# Patient Record
Sex: Male | Born: 1980 | Race: White | Hispanic: No | Marital: Married | Smoking: Never smoker
Health system: Southern US, Community
[De-identification: ages and names within clinical notes are randomized; demographics above are authoritative.]

## PROBLEM LIST (undated history)

## (undated) DIAGNOSIS — I491 Atrial premature depolarization: Secondary | ICD-10-CM

## (undated) DIAGNOSIS — I10 Essential (primary) hypertension: Secondary | ICD-10-CM

## (undated) DIAGNOSIS — R0600 Dyspnea, unspecified: Secondary | ICD-10-CM

## (undated) DIAGNOSIS — R002 Palpitations: Secondary | ICD-10-CM

## (undated) DIAGNOSIS — Z91018 Allergy to other foods: Secondary | ICD-10-CM

## (undated) DIAGNOSIS — I493 Ventricular premature depolarization: Secondary | ICD-10-CM

## (undated) DIAGNOSIS — G629 Polyneuropathy, unspecified: Secondary | ICD-10-CM

## (undated) DIAGNOSIS — R0609 Other forms of dyspnea: Secondary | ICD-10-CM

## (undated) DIAGNOSIS — G4733 Obstructive sleep apnea (adult) (pediatric): Secondary | ICD-10-CM

## (undated) DIAGNOSIS — E785 Hyperlipidemia, unspecified: Secondary | ICD-10-CM

## (undated) DIAGNOSIS — K219 Gastro-esophageal reflux disease without esophagitis: Secondary | ICD-10-CM

## (undated) HISTORY — DX: Palpitations: R00.2

## (undated) HISTORY — PX: NO PAST SURGERIES: SHX2092

## (undated) HISTORY — DX: Dyspnea, unspecified: R06.00

## (undated) HISTORY — DX: Other forms of dyspnea: R06.09

## (undated) HISTORY — DX: Atrial premature depolarization: I49.1

## (undated) HISTORY — DX: Ventricular premature depolarization: I49.3

## (undated) HISTORY — DX: Gastro-esophageal reflux disease without esophagitis: K21.9

## (undated) HISTORY — DX: Polyneuropathy, unspecified: G62.9

## (undated) HISTORY — DX: Allergy to other foods: Z91.018

## (undated) HISTORY — DX: Hyperlipidemia, unspecified: E78.5

## (undated) HISTORY — DX: Obstructive sleep apnea (adult) (pediatric): G47.33

## (undated) HISTORY — DX: Essential (primary) hypertension: I10

---

## 2018-04-18 ENCOUNTER — Ambulatory Visit (INDEPENDENT_AMBULATORY_CARE_PROVIDER_SITE_OTHER): Payer: PRIVATE HEALTH INSURANCE | Admitting: Cardiology

## 2018-04-18 ENCOUNTER — Encounter: Payer: Self-pay | Admitting: Cardiology

## 2018-04-18 DIAGNOSIS — R0789 Other chest pain: Secondary | ICD-10-CM | POA: Diagnosis not present

## 2018-04-18 DIAGNOSIS — G4733 Obstructive sleep apnea (adult) (pediatric): Secondary | ICD-10-CM

## 2018-04-18 DIAGNOSIS — R0609 Other forms of dyspnea: Secondary | ICD-10-CM

## 2018-04-18 DIAGNOSIS — I10 Essential (primary) hypertension: Secondary | ICD-10-CM

## 2018-04-18 DIAGNOSIS — E782 Mixed hyperlipidemia: Secondary | ICD-10-CM

## 2018-04-18 NOTE — Patient Instructions (Signed)
Medication Instructions:  Your physician recommends that you continue on your current medications as directed. Please refer to the Current Medication list given to you today.  If you need a refill on your cardiac medications before your next appointment, please call your pharmacy.   Lab work: None.  If you have labs (blood work) drawn today and your tests are completely normal, you will receive your results only by: Marland Kitchen. MyChart Message (if you have MyChart) OR . A paper copy in the mail If you have any lab test that is abnormal or we need to change your treatment, we will call you to review the results.  Testing/Procedures: Your physician has requested that you have an echocardiogram. Echocardiography is a painless test that uses sound waves to create images of your heart. It provides your doctor with information about the size and shape of your heart and how well your heart's chambers and valves are working. This procedure takes approximately one hour. There are no restrictions for this procedure.  Your physician has requested that you have a stress echocardiogram. For further information please visit [email or web address]. Please follow instruction sheet as given.    Follow-Up: At [facility], you and your health needs are our priority.  As part of our continuing mission to provide you with exceptional heart care, we have created designated Provider Care Teams.  These Care Teams include your primary Cardiologist (physician) and Advanced Practice Providers (APPs -  Physician Assistants and Nurse Practitioners) who all work together to provide you with the care you need, when you need it. You will need a follow up appointment in 6 weeks.  Please call our office 2 months in advance to schedule this appointment.  You may see No primary care provider on file. or another member of our [facility] Provider Team in : Norman HerrlichBrian Munley, MD . Belva Cromeajan Revankar, MD  Any Other Special Instructions  Will Be Listed Below (If Applicable).  Echocardiogram An echocardiogram, or echocardiography, uses sound waves (ultrasound) to produce an image of your heart. The echocardiogram is simple, painless, obtained within a short period of time, and offers valuable information to your health care provider. The images from an echocardiogram can provide information such as:  Evidence of coronary artery disease (CAD).  Heart size.  Heart muscle function.  Heart valve function.  Aneurysm detection.  Evidence of a past heart attack.  Fluid buildup around the heart.  Heart muscle thickening.  Assess heart valve function.  Tell a health care provider about:  Any allergies you have.  All medicines you are taking, including vitamins, herbs, eye drops, creams, and over-the-counter medicines.  Any problems you or family members have had with anesthetic medicines.  Any blood disorders you have.  Any surgeries you have had.  Any medical conditions you have.  Whether you are pregnant or may be pregnant. What happens before the procedure? No special preparation is needed. Eat and drink normally. What happens during the procedure?  In order to produce an image of your heart, gel will be applied to your chest and a wand-like tool (transducer) will be moved over your chest. The gel will help transmit the sound waves from the transducer. The sound waves will harmlessly bounce off your heart to allow the heart images to be captured in real-time motion. These images will then be recorded.  You may need an IV to receive a medicine that improves the quality of the pictures. What happens after the procedure? You may return to your  normal schedule including diet, activities, and medicines, unless your health care provider tells you otherwise. This information is not intended to replace advice given to you by your health care provider. Make sure you discuss any questions you have with your health care  provider. Document Released: 05/12/2000 Document Revised: 01/01/2016 Document Reviewed: 01/20/2013 Elsevier Interactive Patient Education  2017 Elsevier Inc.   Exercise Stress Echocardiogram An exercise stress echocardiogram is a test to check how well your heart is working. This test uses sound waves (ultrasound) and a computer to make images of your heart before and after exercise. Ultrasound images that are taken before you exercise (your resting echocardiogram) will show how much blood is getting to your heart muscle and how well your heart muscle and heart valves are functioning. During the next part of this test, you will walk on a treadmill or ride a stationary bike to see how exercise affects your heart. While you exercise, the electrical activity of your heart will be monitored with an electrocardiogram (ECG). Your blood pressure will also be monitored. You may have this test if you:  Have chest pain or other symptoms of a heart problem.  Recently had a heart attack or heart surgery.  Have heart valve problems.  Have a condition that causes narrowing of the blood vessels that supply your heart (coronary artery disease).  Have a high risk of heart disease and are starting a new exercise program.  Have a high risk of heart disease and need to have major surgery.  Tell a health care provider about:  Any allergies you have.  All medicines you are taking, including vitamins, herbs, eye drops, creams, and over-the-counter medicines.  Any problems you or family members have had with anesthetic medicines.  Any blood disorders you have.  Any surgeries you have had.  Any medical conditions you have.  Whether you are pregnant or may be pregnant. What are the risks? Generally, this is a safe procedure. However, problems may occur, including:  Chest pain.  Dizziness or light-headedness.  Shortness of breath.  Increased or irregular heartbeat (palpitations).  Nausea or  vomiting.  Heart attack (very rare).  What happens before the procedure?  Follow instructions from your health care provider about eating or drinking restrictions. You may be asked to avoid all forms of caffeine for 24 hours before your procedure, or as told by your health care provider.  Ask your health care provider about changing or stopping your regular medicines. This is especially important if you are taking diabetes medicines or blood thinners.  If you use an inhaler, bring it with you to the test.  Wear loose, comfortable clothing and walking shoes.  Do notuse any products that contain nicotine or tobacco, such as cigarettes and e-cigarettes, for 4 hours before the test or as told by your health care provider. If you need help quitting, ask your health care provider. What happens during the procedure?  You will take off your clothes from the waist up and put on a hospital gown.  A technician will place electrodes on your chest.  A blood pressure cuff will be placed on your arm.  You will lie down on a table for an ultrasound exam before you exercise. Gel will be rubbed on your chest, and a handheld device (transducer) will be pressed against your chest and moved over your heart.  Then, you will start exercising by walking on a treadmill or pedaling a stationary bicycle.  Your blood pressure and heart  rhythm will be monitored while you exercise.  The exercise will gradually get harder or faster.  You will exercise until: ? Your heart reaches a target level. ? You are too tired to continue. ? You cannot continue because of chest pain, weakness, or dizziness.  You will have another ultrasound exam after you stop exercising. The procedure may vary among health care providers and hospitals. What happens after the procedure?  Your heart rate and blood pressure will be monitored until they return to your normal levels. Summary  An exercise stress echocardiogram is a test  that uses ultrasound to check how well your heart works before and after exercise.  Before the test, follow instructions from your health care provider about stopping medications, avoiding nicotine and tobacco, and avoiding certain foods and drinks.  During the test, your blood pressure and heart rhythm will be monitored while you exercise on a treadmill or stationary bicycle. This information is not intended to replace advice given to you by your health care provider. Make sure you discuss any questions you have with your health care provider. Document Released: 05/19/2004 Document Revised: 01/05/2016 Document Reviewed: 01/05/2016 Elsevier Interactive Patient Education  2018 [employer].

## 2018-04-18 NOTE — Progress Notes (Signed)
Cardiology Consultation:    Date:  04/18/2018   ID:  Real Cons, DOB [birth year], MRN [id number]  PCP:  Nonnie Done., MD  Cardiologist:  Gypsy Balsam, MD   Referring MD: Nonnie Done., MD   Chief Complaint  Patient presents with  . Chest Pain    Felt to be due to sleep apnea, Sleep study on Monday  . Hypertension  I have high blood pressure also chest pain  History of Present Illness:    Martin Berry is a 37 y.o. male who is being seen today for the evaluation of chest pain high blood pressure at the request of [place], Excell Seltzer., MD.  His past medical history significant for hypertension for many years he is taking 2 medications right now 1 his amlodipine second 1 is metoprolol.  He was referred to [place] because of chest pain he does have atypical chest pain he sometimes wake up in the middle of the night only in the morning with heavy-like sensation in the chest walking around moving around make the sensation to get better he is to exercise on the regular basis but does not do it anymore he is too busy.  He works for [employer] which required a lot of walking he is doing well doing it.  He described to have some exertional shortness of breath.  He does snore a lot in the matter-of-fact he is scheduled to have sleep study done on Monday which I think is an excellent idea he said he has been taking medications for his high blood pressure for about 10 years. He does not have significant risk factors for coronary artery disease except for a high blood pressure.  Past Medical History:  Diagnosis Date  . Hyperlipidemia   . Hypertension       Current Medications: Current Meds  Medication Sig  . amLODipine (NORVASC) 5 MG tablet Take 1 tablet by mouth daily.  Marland Kitchen atorvastatin (LIPITOR) 10 MG tablet Take 1 tablet by mouth daily.  . metoprolol succinate (TOPROL-XL) 25 MG 24 hr tablet Take 1 tablet by mouth daily.     Allergies:   Patient has no known allergies.   Social  History   Socioeconomic History  . Marital status: Married    Spouse name: Not on file  . Number of children: Not on file  . Years of education: Not on file  . Highest education level: Not on file  Occupational History  . Not on file  Social Needs  . Financial resource strain: Not on file  . Food insecurity:    Worry: Not on file    Inability: Not on file  . Transportation needs:    Medical: Not on file    Non-medical: Not on file  Tobacco Use  . Smoking status: Former Games developer  . Smokeless tobacco: Former Engineer, water and Sexual Activity  . Alcohol use: Never    Frequency: Never  . Drug use: Never  . Sexual activity: Not on file  Lifestyle  . Physical activity:    Days per week: Not on file    Minutes per session: Not on file  . Stress: Not on file  Relationships  . Social connections:    Talks on phone: Not on file    Gets together: Not on file    Attends religious service: Not on file    Active member of club or organization: Not on file    Attends meetings of clubs or organizations: Not on  file    Relationship status: Not on file  Other Topics Concern  . Not on file  Social History Narrative  . Not on file     Family History: The patient's family history includes Healthy in his mother; Hyperlipidemia in his father. ROS:   Please see the history of present illness.    All 14 point review of systems negative except as described per history of present illness.  EKGs/Labs/Other Studies Reviewed:    The following studies were reviewed today:   EKG:  EKG is  ordered today.  The ekg ordered today demonstrates sinus bradycardia rate 53 normal P interval normal QS complex duration morphology no evidence of left ventricle hypertrophy  Recent Labs: No results found for requested labs within last 8760 hours.  Recent Lipid Panel No results found for: CHOL, TRIG, HDL, CHOLHDL, VLDL, LDLCALC, LDLDIRECT  Physical Exam:    VS:  BP 140/90   Pulse 60   Ht 6\' 3"   (1.905 m)   Wt 274 lb (124.3 kg)   SpO2 96%   BMI 34.25 kg/m     Wt Readings from Last 3 Encounters:  04/18/18 274 lb (124.3 kg)     GEN:  Well nourished, well developed in no acute distress HEENT: Normal NECK: No JVD; No carotid bruits LYMPHATICS: No lymphadenopathy CARDIAC: RRR, no murmurs, no rubs, no gallops RESPIRATORY:  Clear to auscultation without rales, wheezing or rhonchi  ABDOMEN: Soft, non-tender, non-distended MUSCULOSKELETAL:  No edema; No deformity  SKIN: Warm and dry NEUROLOGIC:  Alert and oriented x 3 PSYCHIATRIC:  Normal affect   ASSESSMENT:    1. Obstructive sleep apnea   2. Essential hypertension   3. Atypical chest pain   4. Dyspnea on exertion   5. Mixed dyslipidemia    PLAN:    In order of problems listed above:  1. Essential hypertension.  Blood pressure elevated slightly today but this is first visit in my office.  I asked him to continue present management.  Asking also to get blood pressure monitor so he can check his blood pressure on the regular basis.  We talked about nonpharmacological ways to help with the blood pressure which includes avoiding salty food, exercises on the regular basis weight loss.  It looks like he does have significant sleep apnea and appropriate recognition of this problem and appropriate treatment will be very beneficial to him. 2. Obstructive sleep apnea sleep study scheduled for Monday. 3. Atypical chest pain I doubt very much that this is related to his heart however I will schedule him to have stress echocardiogram.  I want to make sure he does not have any significant coronary artery disease also would like to see response of his blood pressure to exercise as well as to see his exercise capacity.  If stress this is normal dental pressure and heart was doing exercises on the regular basis. 4. Mixed dyslipidemia on statin which I will continue.   Medication Adjustments/Labs and Tests Ordered: Current medicines are  reviewed at length with the patient today.  Concerns regarding medicines are outlined above.  No orders of the defined types were placed in this encounter.  No orders of the defined types were placed in this encounter.   Signed, Georgeanna Leaobert J. Alexy Bringle, MD, [facility]. 04/18/2018 10:06 AM    [facility]

## 2018-04-29 ENCOUNTER — Ambulatory Visit (INDEPENDENT_AMBULATORY_CARE_PROVIDER_SITE_OTHER): Payer: PRIVATE HEALTH INSURANCE

## 2018-04-29 ENCOUNTER — Telehealth: Payer: Self-pay | Admitting: Emergency Medicine

## 2018-04-29 DIAGNOSIS — R0789 Other chest pain: Secondary | ICD-10-CM

## 2018-04-29 DIAGNOSIS — R0609 Other forms of dyspnea: Secondary | ICD-10-CM

## 2018-04-29 MED ORDER — TELMISARTAN-HCTZ 40-12.5 MG PO TABS: 1.0000 | ORAL_TABLET | Freq: Every day | ORAL | 3 refills | Status: DC

## 2018-04-29 NOTE — Telephone Encounter (Signed)
Patient's wife calling concerned about stress echo that was done this morning. She reports patient is concerned and wanted clarification on the situation. Informed patient's wife that the patient's blood pressure increased too much for us to continue his stress echo this morning. Dr. Dulce SellarMunley advised we change his medication to get his blood pressure better controlled and have the patient follow up with Dr. Bing MatterKrasowski about possibly doing a cardiac ct. Patient's wife verbally understands, confirmed follow up appointment and she will contact us with any concerns. She would like us to confirm with Dr. Dulce SellarMunley if there are any work restrictions for the patient, I will confirm with him

## 2018-04-29 NOTE — Progress Notes (Signed)
Complete stress echocardiogram has been performed.   Jimmy Tiegan Jambor RDCS, RVT

## 2018-04-29 NOTE — Patient Instructions (Signed)
Medication Instructions:  Your physician has recommended you make the following change in your medication:   START: Micardis- 40-12.5 mg daily.  If you need a refill on your cardiac medications before your next appointment, please call your pharmacy.   Lab work: None. If you have labs (blood work) drawn today and your tests are completely normal, you will receive your results only by: Marland Kitchen. MyChart Message (if you have MyChart) OR . A paper copy in the mail If you have any lab test that is abnormal or we need to change your treatment, we will call you to review the results.  Testing/Procedures: None.   Follow-Up: At [facility], you and your health needs are our priority.  As part of our continuing mission to provide you with exceptional heart care, we have created designated Provider Care Teams.  These Care Teams include your primary Cardiologist (physician) and Advanced Practice Providers (APPs -  Physician Assistants and Nurse Practitioners) who all work together to provide you with the care you need, when you need it. You will need a follow up appointment in 2 weeks.  Please call our office 2 months in advance to schedule this appointment.  You may see No primary care provider on file. or another member of our [facility] Provider Team in [place]: Norman HerrlichBrian Munley, MD . Belva Cromeajan Revankar, MD  Any Other Special Instructions Will Be Listed Below (If Applicable).  Hydrochlorothiazide, HCTZ; Telmisartan tablets What is this medicine? TELMISARTAN; HYDROCHLOROTHIAZIDE (tel mi SAR tan; hye droe klor oh THYE a zide) is a combination of a drug that relaxes blood vessels and a diuretic. It is used to treat high blood pressure. This medicine may be used for other purposes; ask your health care provider or pharmacist if you have questions. COMMON BRAND NAME(S): Micardis HCT What should I tell my health care provider before I take this medicine? They need to know if you have any of these  conditions: -decreased urine -if you are on a special diet, like a low-salt diet -immune system problems, like lupus -kidney disease -liver disease -an unusual or allergic reaction to telmisartan, hydrochlorothiazide, sulfa drugs, other medicines, foods, dyes, or preservatives -pregnant or trying to get pregnant -breast-feeding How should I use this medicine? Take this medicine by mouth with a drink of water. Follow the directions on the prescription label. You can take it with or without food. If it upsets your stomach, take it with food. Take your medicine at regular intervals. Do not take it more often than directed. Do not stop taking except on your doctor's advice. Talk to your pediatrician regarding the use of this medicine in children. Special care may be needed. Overdosage: If you think you have taken too much of this medicine contact a poison control center or emergency room at once. NOTE: This medicine is only for you. Do not share this medicine with others. What if I miss a dose? If you miss a dose, take it as soon as you can. If it is almost time for your next dose, take only that dose. Do not take double or extra doses. What may interact with this medicine? -barbiturates, like phenobarbital -blood pressure medicines -corticosteroids -diabetic medicines -digoxin -diuretics, especially triamterene, spironolactone or amiloride -lithium -NSAIDs, medicines for pain and inflammation, like ibuprofen or naproxen -potassium salts or potassium supplements -prescription pain medicines -skeletal muscle relaxants like tubocurarine -some cholesterol lowering medicines like cholestyramine or colestipol -warfarin This list may not describe all possible interactions. Give your health care  provider a list of all the medicines, herbs, non-prescription drugs, or dietary supplements you use. Also tell them if you smoke, drink alcohol, or use illegal drugs. Some items may interact with your  medicine. What should I watch for while using this medicine? Check your blood pressure regularly while you are taking this medicine. Ask your doctor or health care professional what your blood pressure should be and when you should contact him or her. When you check your blood pressure, write down the measurements to show your doctor or health care professional. If you are taking this medicine for a long time, you must visit your health care professional for regular checks on your progress. Make sure you schedule appointments on a regular basis. You must not get dehydrated. Ask your doctor or health care professional how much fluid you need to drink a day. Check with him or her if you get an attack of severe diarrhea, nausea and vomiting, or if you sweat a lot. The loss of too much body fluid can make it dangerous for you to take this medicine. Women should inform their doctor if they wish to become pregnant or think they might be pregnant. There is a potential for serious side effects to an unborn child, particularly in the second or third trimester. Talk to your health care professional or pharmacist for more information. You may get drowsy or dizzy. Do not drive, use machinery, or do anything that needs mental alertness until you know how this drug affects you. Do not stand or sit up quickly, especially if you are an older patient. This reduces the risk of dizzy or fainting spells. Alcohol can make you more drowsy and dizzy. Avoid alcoholic drinks. This medicine may affect your blood sugar level. If you have diabetes, check with your doctor or health care professional before changing the dose of your diabetic medicine. Avoid salt substitutes unless you are told otherwise by your doctor or health care professional. Do not treat yourself for coughs, colds, or pain while you are taking this medicine without asking your doctor or health care professional for advice. Some ingredients may increase your blood  pressure. This medicine can make you more sensitive to the sun. Keep out of the sun. If you cannot avoid being in the sun, wear protective clothing and use sunscreen. Do not use sun lamps or tanning beds/booths. What side effects may I notice from receiving this medicine? Side effects that you should report to your doctor or health care professional as soon as possible: -allergic reactions like skin rash, itching or hives, swelling of the face, lips, or tongue -breathing problems -changes in vision -dark urine -eye pain -fast or irregular heart beat, palpitations, or chest pain -feeling faint or lightheaded -muscle cramps -persistent dry cough -redness, blistering, peeling or loosening of the skin, including inside the mouth -stomach pain -trouble passing urine or change in the amount of urine -unusual bleeding or bruising -worsened gout pain -yellowing of the eyes or skin Side effects that usually do not require medical attention (report to your doctor or health care professional if they continue or are bothersome): -change in sex drive or performance -headache This list may not describe all possible side effects. Call your doctor for medical advice about side effects. You may report side effects to FDA at 1-800-FDA-1088. Where should I keep my medicine? Keep out of the reach of children. Store at room temperature between 15 and 30 degrees C (59 and 86 degrees F). Tablets should not  be removed from the blisters until right before use. Throw away any unused medicine after the expiration date. NOTE: This sheet is a summary. It may not cover all possible information. If you have questions about this medicine, talk to your doctor, pharmacist, or health care provider.  2018 Elsevier/Gold Standard (2010-02-02 14:52:10)

## 2018-04-29 NOTE — Progress Notes (Addendum)
Complete echocardiogram has been performed.  Jimmy Pier Bosher RDCS, RVT 

## 2018-05-13 ENCOUNTER — Ambulatory Visit (INDEPENDENT_AMBULATORY_CARE_PROVIDER_SITE_OTHER): Payer: PRIVATE HEALTH INSURANCE | Admitting: Cardiology

## 2018-05-13 ENCOUNTER — Encounter: Payer: Self-pay | Admitting: Cardiology

## 2018-05-13 VITALS — BP 130/64 | HR 60 | Ht 75.0 in | Wt 271.0 lb

## 2018-05-13 DIAGNOSIS — E782 Mixed hyperlipidemia: Secondary | ICD-10-CM | POA: Diagnosis not present

## 2018-05-13 DIAGNOSIS — R0789 Other chest pain: Secondary | ICD-10-CM | POA: Diagnosis not present

## 2018-05-13 DIAGNOSIS — G4733 Obstructive sleep apnea (adult) (pediatric): Secondary | ICD-10-CM

## 2018-05-13 DIAGNOSIS — I10 Essential (primary) hypertension: Secondary | ICD-10-CM | POA: Diagnosis not present

## 2018-05-13 NOTE — Progress Notes (Signed)
Cardiology Office Note:    Date:  05/13/2018   ID:  Martin Berry, DOB [birth year], MRN [id number]  PCP:  Nonnie DoneSlatosky, John J., MD  Cardiologist:  Gypsy Balsamobert Rosetta Rupnow, MD    Referring MD: Nonnie DoneSlatosky, John J., MD   Chief Complaint  Patient presents with  . Follow up on testing  Doing well  History of Present Illness:    Martin Berry is a 37 y.o. male with atypical chest pain.  He did have a stress test which showed no evidence of ischemia echocardiogram showed LVH with left atrial enlargement.  He clearly does have sequela of high blood pressure.  He is already on 3 medications and my intention was to potentially increase the dose of some of the medication but he just had a sleep study done which showed severe sleep apnea this weekend he is going to have a titration study will wait for proper management of sleep apnea before adjusting his medication for blood pressure.  Past Medical History:  Diagnosis Date  . Hyperlipidemia   . Hypertension       Current Medications: Current Meds  Medication Sig  . amLODipine (NORVASC) 5 MG tablet Take 1 tablet by mouth daily.  Marland Kitchen. atorvastatin (LIPITOR) 10 MG tablet Take 1 tablet by mouth daily.  . metoprolol succinate (TOPROL-XL) 25 MG 24 hr tablet Take 1 tablet by mouth daily.  Marland Kitchen. telmisartan-hydrochlorothiazide (MICARDIS HCT) 40-12.5 MG tablet Take 1 tablet by mouth daily.     Allergies:   Patient has no known allergies.   Social History   Socioeconomic History  . Marital status: Married    Spouse name: Not on file  . Number of children: Not on file  . Years of education: Not on file  . Highest education level: Not on file  Occupational History  . Not on file  Social Needs  . Financial resource strain: Not on file  . Food insecurity:    Worry: Not on file    Inability: Not on file  . Transportation needs:    Medical: Not on file    Non-medical: Not on file  Tobacco Use  . Smoking status: Former Games developermoker  . Smokeless tobacco: Former Research scientist (medical)User    Substance and Sexual Activity  . Alcohol use: Never    Frequency: Never  . Drug use: Never  . Sexual activity: Not on file  Lifestyle  . Physical activity:    Days per week: Not on file    Minutes per session: Not on file  . Stress: Not on file  Relationships  . Social connections:    Talks on phone: Not on file    Gets together: Not on file    Attends religious service: Not on file    Active member of club or organization: Not on file    Attends meetings of clubs or organizations: Not on file    Relationship status: Not on file  Other Topics Concern  . Not on file  Social History Narrative  . Not on file     Family History: The patient's family history includes Healthy in his mother; Hyperlipidemia in his father. ROS:   Please see the history of present illness.    All 14 point review of systems negative except as described per history of present illness  EKGs/Labs/Other Studies Reviewed:      Recent Labs: No results found for requested labs within last 8760 hours.  Recent Lipid Panel No results found for: CHOL, TRIG, HDL, CHOLHDL, VLDL, LDLCALC, LDLDIRECT  Physical Exam:    VS:  BP 130/64   Pulse 60   Ht 6\' 3"  (1.905 m)   Wt 271 lb (122.9 kg)   SpO2 98%   BMI 33.87 kg/m     Wt Readings from Last 3 Encounters:  05/13/18 271 lb (122.9 kg)  04/18/18 274 lb (124.3 kg)     GEN:  Well nourished, well developed in no acute distress HEENT: Normal NECK: No JVD; No carotid bruits LYMPHATICS: No lymphadenopathy CARDIAC: RRR, no murmurs, no rubs, no gallops RESPIRATORY:  Clear to auscultation without rales, wheezing or rhonchi  ABDOMEN: Soft, non-tender, non-distended MUSCULOSKELETAL:  No edema; No deformity  SKIN: Warm and dry LOWER EXTREMITIES: no swelling NEUROLOGIC:  Alert and oriented x 3 PSYCHIATRIC:  Normal affect   ASSESSMENT:    1. Essential hypertension   2. Atypical chest pain   3. Obstructive sleep apnea   4. Mixed dyslipidemia    PLAN:     In order of problems listed above:  1. Essential hypertension 3 medications already on board awaiting for sleep study titration and CPAP management. 2. Atypical chest pain stress test negative. 3. Obstructive sleep apnea scheduled to have sleep study for mask adjustment this week 4. Mixed dyslipidemia on atorvastatin which I will continue.   Medication Adjustments/Labs and Tests Ordered: Current medicines are reviewed at length with the patient today.  Concerns regarding medicines are outlined above.  No orders of the defined types were placed in this encounter.  Medication changes: No orders of the defined types were placed in this encounter.   Signed, Georgeanna Lea, MD, [facility] 05/13/2018 9:15 AM    [facility]

## 2018-05-13 NOTE — Patient Instructions (Signed)
Medication Instructions:  Your physician recommends that you continue on your current medications as directed. Please refer to the Current Medication list given to you today.  If you need a refill on your cardiac medications before your next appointment, please call your pharmacy.   Lab work: None ordered If you have labs (blood work) drawn today and your tests are completely normal, you will receive your results only by: Marland Kitchen. MyChart Message (if you have MyChart) OR . A paper copy in the mail If you have any lab test that is abnormal or we need to change your treatment, we will call you to review the results.  Testing/Procedures: None ordered  Follow-Up: At [facility], you and your health needs are our priority.  As part of our continuing mission to provide you with exceptional heart care, we have created designated Provider Care Teams.  These Care Teams include your primary Cardiologist (physician) and Advanced Practice Providers (APPs -  Physician Assistants and Nurse Practitioners) who all work together to provide you with the care you need, when you need it. You will need a follow up appointment in 2 months.  Please call our office 2 months in advance to schedule this appointment.  You may see  Gypsy Balsamobert Krasowski or another member of our [facility] Provider Team in [place]: Norman HerrlichBrian Munley, MD . Belva Cromeajan Revankar, MD  Any Other Special Instructions Will Be Listed Below (If Applicable).

## 2018-06-03 ENCOUNTER — Ambulatory Visit: Payer: PRIVATE HEALTH INSURANCE | Admitting: Cardiology

## 2018-07-12 ENCOUNTER — Ambulatory Visit: Payer: PRIVATE HEALTH INSURANCE | Admitting: Cardiology

## 2018-07-29 ENCOUNTER — Encounter: Payer: Self-pay | Admitting: Cardiology

## 2018-07-29 ENCOUNTER — Ambulatory Visit (INDEPENDENT_AMBULATORY_CARE_PROVIDER_SITE_OTHER): Payer: PRIVATE HEALTH INSURANCE | Admitting: Cardiology

## 2018-07-29 VITALS — BP 130/74 | HR 66 | Ht 75.0 in | Wt 274.4 lb

## 2018-07-29 DIAGNOSIS — R0609 Other forms of dyspnea: Secondary | ICD-10-CM | POA: Diagnosis not present

## 2018-07-29 DIAGNOSIS — G4733 Obstructive sleep apnea (adult) (pediatric): Secondary | ICD-10-CM

## 2018-07-29 DIAGNOSIS — I10 Essential (primary) hypertension: Secondary | ICD-10-CM

## 2018-07-29 DIAGNOSIS — R0789 Other chest pain: Secondary | ICD-10-CM | POA: Diagnosis not present

## 2018-07-29 MED ORDER — TELMISARTAN-HCTZ 40-12.5 MG PO TABS: 1.0000 | ORAL_TABLET | Freq: Every day | ORAL | 5 refills | Status: DC

## 2018-07-29 NOTE — Patient Instructions (Signed)
Medication Instructions:  Your physician recommends that you continue on your current medications as directed. Please refer to the Current Medication list given to you today.  If you need a refill on your cardiac medications before your next appointment, please call your pharmacy.   Lab work: NONE   Testing/Procedures: NONE  Follow-Up: At [facility], you and your health needs are our priority.  As part of our continuing mission to provide you with exceptional heart care, we have created designated Provider Care Teams.  These Care Teams include your primary Cardiologist (physician) and Advanced Practice Providers (APPs -  Physician Assistants and Nurse Practitioners) who all work together to provide you with the care you need, when you need it. You will need a follow up appointment in 5 months.

## 2018-07-29 NOTE — Progress Notes (Signed)
Cardiology Office Note:    Date:  07/29/2018   ID:  Real Cons, DOB [birth year], MRN [id number]  PCP:  Nonnie Done., MD  Cardiologist:  Gypsy Balsam, MD    Referring MD: Nonnie Done., MD   Chief Complaint  Patient presents with  . 2 month follow up  Doing better  History of Present Illness:    Martin Berry is a 38 y.o. male with past medical history significant for hypertension dyslipidemia sleep apnea.  All problems appears to be well managed his blood pressure is good he started walking on a regular basis he did have some rough time adjusting to CPAP mask or some changes in the equipment but finally he seems to be tolerating is very well and doing well.  Past Medical History:  Diagnosis Date  . Hyperlipidemia   . Hypertension       Current Medications: Current Meds  Medication Sig  . amLODipine (NORVASC) 5 MG tablet Take 1 tablet by mouth daily.  Marland Kitchen atorvastatin (LIPITOR) 10 MG tablet Take 1 tablet by mouth daily.  . metoprolol succinate (TOPROL-XL) 25 MG 24 hr tablet Take 1 tablet by mouth daily.  Marland Kitchen telmisartan-hydrochlorothiazide (MICARDIS HCT) 40-12.5 MG tablet Take 1 tablet by mouth daily.     Allergies:   Patient has no known allergies.   Social History   Socioeconomic History  . Marital status: Married    Spouse name: Not on file  . Number of children: Not on file  . Years of education: Not on file  . Highest education level: Not on file  Occupational History  . Not on file  Social Needs  . Financial resource strain: Not on file  . Food insecurity:    Worry: Not on file    Inability: Not on file  . Transportation needs:    Medical: Not on file    Non-medical: Not on file  Tobacco Use  . Smoking status: Former Games developer  . Smokeless tobacco: Former Engineer, water and Sexual Activity  . Alcohol use: Never    Frequency: Never  . Drug use: Never  . Sexual activity: Not on file  Lifestyle  . Physical activity:    Days per week: Not on file      Minutes per session: Not on file  . Stress: Not on file  Relationships  . Social connections:    Talks on phone: Not on file    Gets together: Not on file    Attends religious service: Not on file    Active member of club or organization: Not on file    Attends meetings of clubs or organizations: Not on file    Relationship status: Not on file  Other Topics Concern  . Not on file  Social History Narrative  . Not on file     Family History: The patient's family history includes Healthy in his mother; Hyperlipidemia in his father. ROS:   Please see the history of present illness.    All 14 point review of systems negative except as described per history of present illness  EKGs/Labs/Other Studies Reviewed:      Recent Labs: No results found for requested labs within last 8760 hours.  Recent Lipid Panel No results found for: CHOL, TRIG, HDL, CHOLHDL, VLDL, LDLCALC, LDLDIRECT  Physical Exam:    VS:  BP 130/74   Pulse 66   Ht 6\' 3"  (1.905 m)   Wt 274 lb 6.4 oz (124.5 kg)   SpO2 96%  BMI 34.30 kg/m     Wt Readings from Last 3 Encounters:  07/29/18 274 lb 6.4 oz (124.5 kg)  05/13/18 271 lb (122.9 kg)  04/18/18 274 lb (124.3 kg)     GEN:  Well nourished, well developed in no acute distress HEENT: Normal NECK: No JVD; No carotid bruits LYMPHATICS: No lymphadenopathy CARDIAC: RRR, no murmurs, no rubs, no gallops RESPIRATORY:  Clear to auscultation without rales, wheezing or rhonchi  ABDOMEN: Soft, non-tender, non-distended MUSCULOSKELETAL:  No edema; No deformity  SKIN: Warm and dry LOWER EXTREMITIES: no swelling NEUROLOGIC:  Alert and oriented x 3 PSYCHIATRIC:  Normal affect   ASSESSMENT:    1. Atypical chest pain   2. Dyspnea on exertion   3. Obstructive sleep apnea   4. Essential hypertension    PLAN:    In order of problems listed above:  1. Atypical chest pain denies having any. 2. Dyspnea on exertion gradually start increasing distance of  exercises and seems to be doing well from that point review. 3. Obstructive sleep apnea some adjustment time to his mask but now he seems to be tolerating very well.  He see improvement in his overall wellbeing by using mask on a regular basis. 4. Essential hypertension blood pressure appears to be well controlled we will continue present medications. 5. I see him back in my office in about 5 months.   Medication Adjustments/Labs and Tests Ordered: Current medicines are reviewed at length with the patient today.  Concerns regarding medicines are outlined above.  No orders of the defined types were placed in this encounter.  Medication changes: No orders of the defined types were placed in this encounter.   Signed, Georgeanna Lea, MD, [facility] 07/29/2018 8:31 AM    [facility]

## 2018-08-12 ENCOUNTER — Encounter: Payer: Self-pay | Admitting: Cardiology

## 2018-09-30 ENCOUNTER — Other Ambulatory Visit: Payer: Self-pay

## 2018-09-30 ENCOUNTER — Encounter: Payer: Self-pay | Admitting: Cardiology

## 2018-09-30 ENCOUNTER — Telehealth (INDEPENDENT_AMBULATORY_CARE_PROVIDER_SITE_OTHER): Payer: PRIVATE HEALTH INSURANCE | Admitting: Cardiology

## 2018-09-30 ENCOUNTER — Telehealth: Payer: Self-pay | Admitting: Cardiology

## 2018-09-30 VITALS — BP 146/86

## 2018-09-30 DIAGNOSIS — E782 Mixed hyperlipidemia: Secondary | ICD-10-CM | POA: Diagnosis not present

## 2018-09-30 DIAGNOSIS — R0600 Dyspnea, unspecified: Secondary | ICD-10-CM

## 2018-09-30 DIAGNOSIS — R0609 Other forms of dyspnea: Secondary | ICD-10-CM

## 2018-09-30 DIAGNOSIS — R0789 Other chest pain: Secondary | ICD-10-CM | POA: Diagnosis not present

## 2018-09-30 DIAGNOSIS — I10 Essential (primary) hypertension: Secondary | ICD-10-CM | POA: Diagnosis not present

## 2018-09-30 NOTE — Patient Instructions (Signed)
Medication Instructions:  Your physician recommends that you continue on your current medications as directed. Please refer to the Current Medication list given to you today.  If you need a refill on your cardiac medications before your next appointment, please call your pharmacy.   Lab work: None.  If you have labs (blood work) drawn today and your tests are completely normal, you will receive your results only by: . MyChart Message (if you have MyChart) OR . A paper copy in the mail If you have any lab test that is abnormal or we need to change your treatment, we will call you to review the results.  Testing/Procedures: None.   Follow-Up: At [facility], you and your health needs are our priority.  As part of our continuing mission to provide you with exceptional heart care, we have created designated Provider Care Teams.  These Care Teams include your primary Cardiologist (physician) and Advanced Practice Providers (APPs -  Physician Assistants and Nurse Practitioners) who all work together to provide you with the care you need, when you need it. You will need a follow up appointment in 3 months.  Please call our office 2 months in advance to schedule this appointment.  You may see No primary care provider on file. or another member of our [facility] Provider Team in [place]: Brian Munley, MD . Rajan Revankar, MD  Any Other Special Instructions Will Be Listed Below (If Applicable).     

## 2018-09-30 NOTE — Telephone Encounter (Signed)
Per Dr. Bing Matter patient contacted an added on to our schedule today. Email sent for patient to set up mychart, will send consent once he sets up mychart acocunt.

## 2018-09-30 NOTE — Telephone Encounter (Signed)
Patient's wife, AMy states patient has been very short of breath the last 2 weeks and tingling in arms; feels weak; heart racing; fatigued; chest feels heavy.  Spoke to Camillo Flaming said she would call patient's wife back Please call wife back to discuss [phone]

## 2018-09-30 NOTE — Progress Notes (Signed)
Virtual Visit via Video Note   This visit type was conducted due to national recommendations for restrictions regarding the COVID-19 Pandemic (e.g. social distancing) in an effort to limit this patient's exposure and mitigate transmission in our community.  Due to his co-morbid illnesses, this patient is at least at moderate risk for complications without adequate follow up.  This format is felt to be most appropriate for this patient at this time.  All issues noted in this document were discussed and addressed.  A limited physical exam was performed with this format.  Please refer to the patient's chart for his consent to telehealth for [facility].  Evaluation Performed:  Follow-up visit  This visit type was conducted due to national recommendations for restrictions regarding the COVID-19 Pandemic (e.g. social distancing).  This format is felt to be most appropriate for this patient at this time.  All issues noted in this document were discussed and addressed.  No physical exam was performed (except for noted visual exam findings with Video Visits).  Please refer to the patient's chart (MyChart message for video visits and phone note for telephone visits) for the patient's consent to telehealth for [facility].  Date:  09/30/2018  ID: Martin Berry, DOB [birth year], MRN [id number]   Patient Location: [address] [place] [address]   Provider location:   [facility] [place] Office  PCP:  Nonnie Done., MD  Cardiologist:  Gypsy Balsam, MD     Chief Complaint: I do not feel well  History of Present Illness:    Martin Berry is a 38 y.o. male  who presents via audio/video conferencing for a telehealth visit today.  2 days ago he started feeling sick he developed some shortness of breath and mild cough.  He also described to have some tingling in his finger he is very worried about it he went to his primary care physician Levaquin has been started after he took few dosages he  started having some hot flashes also red face and he eventually end up going to the emergency room where he was given some steroids and him antibiotic was switched to doxycycline.  He never had any fever described to have only mild cough shortness of breath appears to be the leading problem.  He said he is fine in the morning but then 1 day progressed he get more shortness of breath.  There is no nausea there is no vomiting he feels overall weak tired he feels like he is sick.  He is very concerned about potentially having coronavirus.   The patient does not have symptoms concerning for COVID-19 infection (fever, chills, cough, or new SHORTNESS OF BREATH).    Prior CV studies:   The following studies were reviewed today:       Past Medical History:  Diagnosis Date  . Hyperlipidemia   . Hypertension        Current Meds  Medication Sig  . amLODipine (NORVASC) 5 MG tablet Take 1 tablet by mouth daily.  Marland Kitchen atorvastatin (LIPITOR) 10 MG tablet Take 1 tablet by mouth daily.  Marland Kitchen doxycycline (VIBRA-TABS) 100 MG tablet Take 1 tablet by mouth 2 (two) times daily.  . metoprolol succinate (TOPROL-XL) 25 MG 24 hr tablet Take 1 tablet by mouth daily.  . prednisoLONE 5 MG (21) TBPK Take 1 each by mouth see administration instructions. Use according to package instructions  . telmisartan-hydrochlorothiazide (MICARDIS HCT) 40-12.5 MG tablet Take 1 tablet by mouth daily.  Family History: The patient's family history includes Healthy in his mother; Hyperlipidemia in his father.   ROS:   Please see the history of present illness.     All other systems reviewed and are negative.   Labs/Other Tests and Data Reviewed:     Recent Labs: No results found for requested labs within last 8760 hours.  Recent Lipid Panel No results found for: CHOL, TRIG, HDL, CHOLHDL, VLDL, LDLCALC, LDLDIRECT    Exam:    Vital Signs:  BP (!) 146/86     Wt Readings from Last 3 Encounters:  07/29/18 274 lb  6.4 oz (124.5 kg)  05/13/18 271 lb (122.9 kg)  04/18/18 274 lb (124.3 kg)     Well nourished, well developed in no acute distress. Alert awake oriented x3 not in any distress talking to me over the phone he walks in his house when he talks to me.  No short of breath.  Diagnosis for this visit:   1. Atypical chest pain   2. Dyspnea on exertion   3. Mixed dyslipidemia   4. Essential hypertension      ASSESSMENT & PLAN:    1.  Constellation of her typical symptoms are more about potentially him having coronavirus infection.  I investigated possibility of testing him for it however it looks like we did not have sufficient number of test and the recommendation for situation like him he simply stay home and isolate himself.  And that is what advised him to do.  I spoke over the home phone with his primary care physician who concur with this assessment.  We will be in touch with the patient with a to 3 days to see how he does in the meantime we will continue doxycycline as well as small dose of steroids. 2.  Atypical chest pain denies having any. 3.  Essential hypertension we will continue monitoring.  COVID-19 Education: The signs and symptoms of COVID-19 were discussed with the patient and how to seek care for testing (follow up with PCP or arrange E-visit).  The importance of social distancing was discussed today.  Patient Risk:   After full review of this patients clinical status, I feel that they are at least moderate risk at this time.  Time:   Today, I have spent 26 minutes with the patient with telehealth technology discussing pt health issues.  I spent 5minutes reviewing her chart before the visit.  Visit was finished at 2:25 PM.    Medication Adjustments/Labs and Tests Ordered: Current medicines are reviewed at length with the patient today.  Concerns regarding medicines are outlined above.  No orders of the defined types were placed in this encounter.  Medication changes: No  orders of the defined types were placed in this encounter.    Disposition: Follow-up in few days  Signed, Georgeanna Leaobert J. Krasowski, MD, [facility] 09/30/2018 2:24 PM    [facility]

## 2018-09-30 NOTE — Telephone Encounter (Signed)
Patient reports recent episodes shortness of breath, gasping for air, upper extremity weakness and panic attack-like feelings but is currently asymptomatic now .     He reports being started on Levaquin 2 weeks ago by PCP for low back pain, dark urine, polyuria and sinus infection-type symptoms of head and sinus pressure. Several days later he developed redness and itching on his face and ears, lip swelling and became short of breath, gasping for air and like his throat was closing up. He felt weakness in his arms and fatigued but denies having had chest pain, pressure or squeezing sensation to chest, neck, jaw or arms. He's also felt fatigued and like his heart is not beating fast enough and sometimes like its racing.     He saw his PCP who discontinued Levaquin, gave him a kenalog injection, an albuterol inhaler and started him on doxycycline for 10 days which he's still taking.     He continues to have the episodes of shortness of breath and heart racing every day around noon. Patient denies wheezing or cough. He went to the ER on Friday but by the time he was seen the episode had resolved. Chest xray there was normal. They did not do an EKG.     VS currently 146/86, HR 64. His BP usually runs 120-130/70. His weight is 260#, denies any recent weight change.      pls advise, tx

## 2018-10-02 ENCOUNTER — Telehealth: Payer: Self-pay | Admitting: Emergency Medicine

## 2018-10-02 NOTE — Telephone Encounter (Signed)
Called patient to check and see how he was feeling. The patient reports the shortness of breath has gotten better, he did have one episode yesterday where he felt like his heart rate increased the highest he saw however was 98. He was tested for COVID 19 though his employer however he has to wait 2-12 days for results. He will call us if anything gets worse.

## 2018-10-22 ENCOUNTER — Telehealth: Payer: Self-pay | Admitting: Cardiology

## 2018-10-22 NOTE — Telephone Encounter (Signed)
Returned patient's call, left voice message.

## 2018-10-22 NOTE — Telephone Encounter (Signed)
Patient is still feeling very weak with pulse in the 100's for no rason. Patient was tested for COVID per RJK and wa negative and had recent labs at PCP per RJK.Marland Kitchen everything checked out good. I scheduled patient an appt next week with RJK but chdecking to see if he needs to be in office visit with DOD or with RJK next week. Please call wife.

## 2018-10-23 NOTE — Telephone Encounter (Signed)
I doubt that it is relate dto his heart, we can do video visit with him

## 2018-10-23 NOTE — Telephone Encounter (Signed)
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 This started about 6-7 days ago after finishing a course of antibiotics.  HR has gone up to 108 which is unusual for him. He states this started when he was antibiotics recently for SOB, head congestion, discolored urine and low back pain. He describes being started on abx by PCP, having an allergic rxn and changed to another anx.     He reports occasional CP at rest or with activity 2-3/10 that lasts approximately 20-30 seconds. Does not radiate to arms, neck or jaw. Medications reviewed, taking all as ordered.      Does not check VS routinely, last BP 140/85, HR 65.   pls advise, tx

## 2018-10-24 ENCOUNTER — Telehealth (INDEPENDENT_AMBULATORY_CARE_PROVIDER_SITE_OTHER): Payer: PRIVATE HEALTH INSURANCE | Admitting: Cardiology

## 2018-10-24 ENCOUNTER — Encounter: Payer: Self-pay | Admitting: Cardiology

## 2018-10-24 ENCOUNTER — Other Ambulatory Visit: Payer: Self-pay

## 2018-10-24 VITALS — BP 143/91 | Wt 256.0 lb

## 2018-10-24 DIAGNOSIS — I10 Essential (primary) hypertension: Secondary | ICD-10-CM

## 2018-10-24 DIAGNOSIS — E782 Mixed hyperlipidemia: Secondary | ICD-10-CM

## 2018-10-24 DIAGNOSIS — R0789 Other chest pain: Secondary | ICD-10-CM

## 2018-10-24 DIAGNOSIS — R0609 Other forms of dyspnea: Secondary | ICD-10-CM

## 2018-10-24 NOTE — Telephone Encounter (Signed)
Pt scheduled for 10:00 virtual visit with Dr. Bing Matter. He is aware of appt and waiting for office call.

## 2018-10-24 NOTE — Patient Instructions (Signed)
Medication Instructions:  Your physician recommends that you continue on your current medications as directed. Please refer to the Current Medication list given to you today.  If you need a refill on your cardiac medications before your next appointment, please call your pharmacy.   Lab work: None.  If you have labs (blood work) drawn today and your tests are completely normal, you will receive your results only by: . MyChart Message (if you have MyChart) OR . A paper copy in the mail If you have any lab test that is abnormal or we need to change your treatment, we will call you to review the results.  Testing/Procedures: None.   Follow-Up: At [facility], you and your health needs are our priority.  As part of our continuing mission to provide you with exceptional heart care, we have created designated Provider Care Teams.  These Care Teams include your primary Cardiologist (physician) and Advanced Practice Providers (APPs -  Physician Assistants and Nurse Practitioners) who all work together to provide you with the care you need, when you need it. You will need a follow up appointment in 3 weeks.  Please call our office 2 months in advance to schedule this appointment.  You may see No primary care provider on file. or another member of our [facility] Provider Team in [place]: Brian Munley, MD . Rajan Revankar, MD  Any Other Special Instructions Will Be Listed Below (If Applicable).     

## 2018-10-24 NOTE — Telephone Encounter (Signed)
Patient has an appointment scheduled for today.

## 2018-10-24 NOTE — Progress Notes (Signed)
Virtual Visit via Video Note   This visit type was conducted due to national recommendations for restrictions regarding the COVID-19 Pandemic (e.g. social distancing) in an effort to limit this patient's exposure and mitigate transmission in our community.  Due to his co-morbid illnesses, this patient is at least at moderate risk for complications without adequate follow up.  This format is felt to be most appropriate for this patient at this time.  All issues noted in this document were discussed and addressed.  A limited physical exam was performed with this format.  Please refer to the patient's chart for his consent to telehealth for [facility].  Evaluation Performed:  Follow-up visit  This visit type was conducted due to national recommendations for restrictions regarding the COVID-19 Pandemic (e.g. social distancing).  This format is felt to be most appropriate for this patient at this time.  All issues noted in this document were discussed and addressed.  No physical exam was performed (except for noted visual exam findings with Video Visits).  Please refer to the patient's chart (MyChart message for video visits and phone note for telephone visits) for the patient's consent to telehealth for [facility].  Date:  10/24/2018  ID: Real Cons, DOB 09-18-1980, MRN [id number]   Patient Location: [address] [place] [address]   Provider location:   [facility] [place] [facility]  PCP:  Nonnie Done., MD  Cardiologist:  Gypsy Balsam, MD     Chief Complaint: Getting better  History of Present Illness:    Martin Berry is a 38 y.o. male  who presents via audio/video conferencing for a telehealth visit today.  With atypical chest pain stress test done in December as well as echocardiogram were normal.  Last time I have seen her he was not doing well he had here for cough suspicion for coronavirus.  He took 2 courses of different antibiotic with finally some improvement.  He  was tested for coronavirus and turned up negative.  Overall getting better but still not up to the par reports to have exertional shortness of breath.  No chest pain tightness squeezing pressure burning chest.  He is trying to walk a little more but getting tired easily also got himself admitted and not his heart rate increasing very quickly.   The patient does not have symptoms concerning for COVID-19 infection (fever, chills, cough, or new SHORTNESS OF BREATH).    Prior CV studies:   The following studies were reviewed today:  Echocardiogram and stress test review were normal that was done in December     Past Medical History:  Diagnosis Date  . Hyperlipidemia   . Hypertension        Current Meds  Medication Sig  . amLODipine (NORVASC) 5 MG tablet Take 1 tablet by mouth daily.  Marland Kitchen atorvastatin (LIPITOR) 10 MG tablet Take 1 tablet by mouth daily.  Marland Kitchen doxycycline (VIBRA-TABS) 100 MG tablet Take 1 tablet by mouth 2 (two) times daily.  . metoprolol succinate (TOPROL-XL) 25 MG 24 hr tablet Take 1 tablet by mouth daily.  . prednisoLONE 5 MG (21) TBPK Take 1 each by mouth see administration instructions. Use according to package instructions  . telmisartan-hydrochlorothiazide (MICARDIS HCT) 40-12.5 MG tablet Take 1 tablet by mouth daily.      Family History: The patient's family history includes Healthy in his mother; Hyperlipidemia in his father.   ROS:   Please see the history of present illness.  All other systems reviewed and are negative.   Labs/Other Tests and Data Reviewed:     Recent Labs: No results found for requested labs within last 8760 hours.  Recent Lipid Panel No results found for: CHOL, TRIG, HDL, CHOLHDL, VLDL, LDLCALC, LDLDIRECT    Exam:    Vital Signs:  BP (!) 143/91   Wt 256 lb (116.1 kg)   BMI 32.00 kg/m     Wt Readings from Last 3 Encounters:  10/24/18 256 lb (116.1 kg)  07/29/18 274 lb 6.4 oz (124.5 kg)  05/13/18 271 lb (122.9 kg)      Well nourished, well developed in no acute distress. Alert awake oriented x3.  To initially start talking on the video link he looks good no swelling but then we started having technical difficulties and switch to phone conversation.  He is wife was participated in our discussion.  Diagnosis for this visit:   1. Essential hypertension   2. Atypical chest pain   3. Dyspnea on exertion   4. Mixed dyslipidemia      ASSESSMENT & PLAN:    1.  Essential hypertension seems to be controlled continue present management. 2.  Atypical chest pain denies having any stress test negative in December. 3.  Dyspnea on exertion seems to be quite bothersome.  He try to walk a little bit more and slightly gradually getting better.  I will invite him to the office in about 2 weeks we will check pulse oximetry and will also do some walking test and see how well he can do with walking.  The fact that he got recent stress test as well as echocardiogram which was normal is encouraging.  He may be simply recovering for whatever sickness he had.  I think it would be beneficial to wait 2 weeks before initiating some additional testing.  In the meantime I will contact his primary care physician and get report of his blood test that he had done. 4.  Mixed dyslipidemia taking Lipitor which I will continue had a long discussion about grapefruit.  He used to take 1 grapefruit every single day in the morning however he read something about the correction between grapefruit and Lipitor and actually advised him not to eat grapefruit juice different food for morning breakfast.  COVID-19 Education: The signs and symptoms of COVID-19 were discussed with the patient and how to seek care for testing (follow up with PCP or arrange E-visit).  The importance of social distancing was discussed today.  Patient Risk:   After full review of this patients clinical status, I feel that they are at least moderate risk at this time.  Time:    Today, I have spent 19 minutes with the patient with telehealth technology discussing pt health issues.  I spent 5 minutes reviewing her chart before the visit.  Visit was finished at 10:24 AM.    Medication Adjustments/Labs and Tests Ordered: Current medicines are reviewed at length with the patient today.  Concerns regarding medicines are outlined above.  No orders of the defined types were placed in this encounter.  Medication changes: No orders of the defined types were placed in this encounter.    Disposition: Follow-up 2 weeks in the office  Signed, Georgeanna Leaobert J. Rhonna Holster, MD, [facility] 10/24/2018 10:26 AM     [facility]

## 2018-10-25 ENCOUNTER — Telehealth: Payer: PRIVATE HEALTH INSURANCE | Admitting: Cardiology

## 2018-10-28 ENCOUNTER — Telehealth: Payer: Self-pay | Admitting: *Deleted

## 2018-10-28 ENCOUNTER — Other Ambulatory Visit: Payer: Self-pay

## 2018-10-28 NOTE — Telephone Encounter (Signed)
That needs to be done by PMD

## 2018-10-28 NOTE — Telephone Encounter (Signed)
Pt's wife called to let us know that pt is having spells of exhaustion, almost passed out end of last week and over weekend. Pt is out of work this week and would like a note saying it is okay and to see if he needs to be seen sooner. Please advise.

## 2018-10-28 NOTE — Telephone Encounter (Signed)
Phoned patient to assess for report of persistent symptoms. Reports being more short of breath after working in the heat and/or strenuous work on Friday. He developed leg pains on Saturday with standing or walking. Denies chest pain, weight gain or edema. He was taking lipitor but stopped several months ago. Patient finished a course of prednisone from an allergic reaction to an antibiotic and finished course of Doxycycline several weeks ago. Labs drawn at [facility] office in March being scanned to patient's chart.     Patient has a quite physical job and isn't able to perform his work duties with current symptoms. He is requesting a work note to be out from 10/28/18 -11/03/18 with return to work date 11/04/18.   pls advise, tx

## 2018-10-29 ENCOUNTER — Telehealth: Payer: PRIVATE HEALTH INSURANCE | Admitting: Cardiology

## 2018-10-29 NOTE — Telephone Encounter (Signed)
Phoned patient, informed that Dr. Bing Matter is directing him to see his PCP about work note. Verbalizes understanding, no further questions or concerns.

## 2018-11-01 ENCOUNTER — Telehealth: Payer: PRIVATE HEALTH INSURANCE | Admitting: Cardiology

## 2018-11-01 ENCOUNTER — Telehealth: Payer: Self-pay

## 2018-11-01 NOTE — Telephone Encounter (Signed)
lmtcb x1 pt and wife

## 2018-11-04 NOTE — Telephone Encounter (Signed)
Attempted to call pt's wife Amy but unable to reach. Left message for her to return call.

## 2018-11-05 ENCOUNTER — Telehealth: Payer: Self-pay | Admitting: Internal Medicine

## 2018-11-05 NOTE — Telephone Encounter (Signed)
Call returned to patient wife, she states she was returning a message from her voicemail regarding her patients appt. She states the appt has been made. Confirmed appt. Voiced understanding. Nothing further needed at this time.

## 2018-11-05 NOTE — Telephone Encounter (Signed)
LMTCB x3 for pt's wife Amy.  We have attempted to contact pt's wife several times with no success or call back from her. Per triage protocol, message will be closed.

## 2018-11-07 ENCOUNTER — Telehealth: Payer: Self-pay | Admitting: Internal Medicine

## 2018-11-07 NOTE — Telephone Encounter (Signed)
Spoke with Martin Berry and I advised her that I did not see the records at this time. She stated she would call them and make sure they faxed the records to our office. I advised her that I would standby to look for them when they send them. She will call me back when she talks with their office. Will keep message open to make sure we receive record.

## 2018-11-11 ENCOUNTER — Encounter: Payer: Self-pay | Admitting: Internal Medicine

## 2018-11-11 ENCOUNTER — Ambulatory Visit (INDEPENDENT_AMBULATORY_CARE_PROVIDER_SITE_OTHER): Payer: PRIVATE HEALTH INSURANCE | Admitting: Internal Medicine

## 2018-11-11 ENCOUNTER — Other Ambulatory Visit: Payer: Self-pay

## 2018-11-11 VITALS — BP 126/80 | HR 55 | Temp 98.3°F | Ht 75.5 in | Wt 261.5 lb

## 2018-11-11 DIAGNOSIS — G4733 Obstructive sleep apnea (adult) (pediatric): Secondary | ICD-10-CM

## 2018-11-11 DIAGNOSIS — R0609 Other forms of dyspnea: Secondary | ICD-10-CM

## 2018-11-11 DIAGNOSIS — J300 Vasomotor rhinitis: Secondary | ICD-10-CM | POA: Diagnosis not present

## 2018-11-11 LAB — CBC WITH DIFFERENTIAL/PLATELET
Basophils Absolute: 0 10*3/uL (ref 0.0–0.1)
Basophils Relative: 0.5 % (ref 0.0–3.0)
Eosinophils Absolute: 0.3 10*3/uL (ref 0.0–0.7)
Eosinophils Relative: 5.2 % — ABNORMAL HIGH (ref 0.0–5.0)
HCT: 46.4 % (ref 39.0–52.0)
Hemoglobin: 16 g/dL (ref 13.0–17.0)
Lymphocytes Relative: 29.5 % (ref 12.0–46.0)
Lymphs Abs: 1.9 10*3/uL (ref 0.7–4.0)
MCHC: 34.4 g/dL (ref 30.0–36.0)
MCV: 87.8 fl (ref 78.0–100.0)
Monocytes Absolute: 0.7 10*3/uL (ref 0.1–1.0)
Monocytes Relative: 10.6 % (ref 3.0–12.0)
Neutro Abs: 3.4 10*3/uL (ref 1.4–7.7)
Neutrophils Relative %: 54.2 % (ref 43.0–77.0)
Platelets: 193 10*3/uL (ref 150.0–400.0)
RBC: 5.28 Mil/uL (ref 4.22–5.81)
RDW: 12.7 % (ref 11.5–15.5)
WBC: 6.4 10*3/uL (ref 4.0–10.5)

## 2018-11-11 LAB — BASIC METABOLIC PANEL
BUN: 11 mg/dL (ref 6–23)
CO2: 30 mEq/L (ref 19–32)
Calcium: 10.2 mg/dL (ref 8.4–10.5)
Chloride: 101 mEq/L (ref 96–112)
Creatinine, Ser: 0.99 mg/dL (ref 0.40–1.50)
GFR: 84.06 mL/min (ref >60.00)
Glucose, Bld: 104 mg/dL — ABNORMAL HIGH (ref 70–99)
Potassium: 4 mEq/L (ref 3.5–5.1)
Sodium: 138 mEq/L (ref 135–145)

## 2018-11-11 LAB — D-DIMER, QUANTITATIVE: D-Dimer, Quant: 0.33 mcg/mL FEU (ref <0.50)

## 2018-11-11 NOTE — Telephone Encounter (Signed)
Spoke with Raquel Sarna who is working with [employer]. She stated that she has the paperwork for his appt with CY today.  Will close this encounter.

## 2018-11-11 NOTE — Patient Instructions (Signed)
Order- DME [facility]- please change CPAP to auto 4-15, mask of choice, humidifier, supplies, AirView/ card  Order- schedule PFT    Dx dyspnea  Order lab- D-dimer, CBC w diff, BMET  Please call as needed

## 2018-11-11 NOTE — Progress Notes (Signed)
615/2020- 38 yoM never smoker for sleep evaluation Medical problem list includes HBP, UTI -----self referred via pt wife; sleep study 04/22/2018; OSA on CPAP, DME: [facility], pt states he does not sleep well with current machine, pressure 7 Wife on speaker phone during this visit. NPSG (Chodri)04/22/18- AHI 33.7/ hr , desaturation to 80%, body weight 273 lbs Epworth score 16 CPAP 7/ [facility] Download compliance 30 %, AHI 5.3/ hr Body weight today 261 lbs Did well initially with CPAP, then began getting recurrent nasal congestion with nasal pillows. Better with change to full face. Wife confirms it stops his snoring.  ENT surgery- none. Denies heart or known lung disease. Additional concern- Dyspnea.  Treated for UTI with levaquin- precipitated rash and acute illness with weakness, myalgias, pain in low back and down backs of legs, shortness of breath. CXR x 2 were reported clear. Test_ for Covid. Rx'd prednisone, kenalog inj, albuterol inhaler. Then PCP gave Symbicort but even 1 puff once daily raises his BP. No personal or family hx clotting. Gradually feeling better with breathing about baseline.  Prior to Admission medications   Medication Sig Start Date End Date Taking? Authorizing Provider  amLODipine (NORVASC) 5 MG tablet Take 1 tablet by mouth daily. 04/07/18  Yes [provider]  atorvastatin (LIPITOR) 10 MG tablet Take 1 tablet by mouth daily. 04/08/18  Yes [provider]  metoprolol succinate (TOPROL-XL) 25 MG 24 hr tablet Take 1 tablet by mouth daily. 04/08/18  Yes [provider]  omeprazole (PRILOSEC) 20 MG capsule  11/08/18  Yes [provider]  SYMBICORT 160-4.5 MCG/ACT inhaler  11/05/18  Yes [provider]  telmisartan-hydrochlorothiazide (MICARDIS HCT) 40-12.5 MG tablet Take 1 tablet by mouth daily. 07/29/18  Yes Park Liter, MD   Past Medical History:  Diagnosis Date  . Hyperlipidemia   . Hypertension    Past Surgical History:   Procedure Laterality Date  . NO PAST SURGERIES     Family History  Problem Relation Age of Onset  . Healthy Mother   . Hyperlipidemia Father    Social History   Socioeconomic History  . Marital status: Married    Spouse name: Not on file  . Number of children: Not on file  . Years of education: Not on file  . Highest education level: Not on file  Occupational History  . Not on file  Social Needs  . Financial resource strain: Not on file  . Food insecurity    Worry: Not on file    Inability: Not on file  . Transportation needs    Medical: Not on file    Non-medical: Not on file  Tobacco Use  . Smoking status: Never Smoker  . Smokeless tobacco: Former Systems developer    Types: Chew  Substance and Sexual Activity  . Alcohol use: Never    Frequency: Never  . Drug use: Never  . Sexual activity: Not on file  Lifestyle  . Physical activity    Days per week: Not on file    Minutes per session: Not on file  . Stress: Not on file  Relationships  . Social Herbalist on phone: Not on file    Gets together: Not on file    Attends religious service: Not on file    Active member of club or organization: Not on file    Attends meetings of clubs or organizations: Not on file    Relationship status: Not on file  . Intimate partner violence  Fear of current or ex partner: Not on file    Emotionally abused: Not on file    Physically abused: Not on file    Forced sexual activity: Not on file  Other Topics Concern  . Not on file  Social History Narrative  . Not on file   ROS-see HPI   + = positive Constitutional:    weight loss, night sweats, fevers, chills, fatigue, lassitude. HEENT:    headaches, difficulty swallowing, tooth/dental problems, sore throat,       sneezing, itching, ear ache, nasal congestion, post nasal drip, snoring CV:    chest pain, orthopnea, PND, swelling in lower extremities, anasarca,                                  dizziness, palpitations Resp:    shortness of breath with exertion or at rest.                productive cough,   non-productive cough, coughing up of blood.              change in color of mucus.  wheezing.   Skin:    rash or lesions. GI:  No-   heartburn, indigestion, abdominal pain, nausea, vomiting, diarrhea,                 change in bowel habits, loss of appetite GU: dysuria, change in color of urine, no urgency or frequency.   flank pain. MS:   joint pain, stiffness, decreased range of motion, back pain. Neuro-     nothing unusual Psych:  change in mood or affect.  depression or anxiety.   memory loss.  OBJ- Physical Exam General- Alert, Oriented, Affect-appropriate, Distress- none acute Skin- rash-none, lesions- none, excoriation- none Lymphadenopathy- none Head- atraumatic            Eyes- Gross vision intact, PERRLA, conjunctivae and secretions clear            Ears- Hearing, canals-normal            Nose- Clear, no-Septal dev, mucus, polyps, erosion, perforation             Throat- Mallampati II , mucosa clear , drainage- none, tonsils- atrophic Neck- flexible , trachea midline, no stridor , thyroid nl, carotid no bruit Chest - symmetrical excursion , unlabored           Heart/CV- RRR , no murmur , no gallop  , no rub, nl s1 s2                           - JVD- none , edema- none, stasis changes- none, varices- none           Lung- clear to P&A, wheeze- none, cough- none , dullness-none, rub- none           Chest wall-  Abd-  Br/ Gen/ Rectal- Not done, not indicated Extrem- cyanosis- none, clubbing, none, atrophy- none, strength- nl Neuro- grossly intact to observation

## 2018-11-11 NOTE — Assessment & Plan Note (Signed)
Nasal congestion triggered by airflow from CPAP. May need to try using 1 puff of Afrin in each nostril at bedtime, avoiding rebound

## 2018-11-11 NOTE — Assessment & Plan Note (Signed)
He benefits from CPAP with improved sleep but comfort issues with nasal congestion and mask are interfering. Plan- He has an Wellsite geologist, so we will change to auto 4-15

## 2018-11-11 NOTE — Assessment & Plan Note (Signed)
He describes what may have been a significant allergic response to levaquin, although a sepsis syndrome from UTI is also possible. This has largely resolved, although he seems still a little unsure of heis breathing. Plan- D-dimer, BMET, CBC, PFT

## 2018-11-12 ENCOUNTER — Ambulatory Visit: Payer: PRIVATE HEALTH INSURANCE | Admitting: Cardiology

## 2018-12-18 ENCOUNTER — Telehealth: Payer: Self-pay | Admitting: Cardiology

## 2018-12-18 NOTE — Telephone Encounter (Signed)
Patient reports for the last 2 weeks he has had swelling in the evening in his legs. The swelling is normally gone by morning. He does report shortness of breath intermittently and denise any weight gain. Will consult with Dr. Agustin Cree.

## 2018-12-18 NOTE — Telephone Encounter (Signed)
Patient is having swelling in his ankles in the pm and wifeis concerned that he is to young for swelling in  His lower extremeties.

## 2018-12-19 ENCOUNTER — Telehealth: Payer: Self-pay | Admitting: Cardiology

## 2018-12-19 MED ORDER — FUROSEMIDE 20 MG PO TABS: 20.0000 mg | ORAL_TABLET | Freq: Every day | ORAL | 1 refills | Status: DC

## 2018-12-19 NOTE — Telephone Encounter (Signed)
Really upset no one called her back yesterday about his legs swelling

## 2018-12-19 NOTE — Addendum Note (Signed)
Addended by: Ashok Norris on: 12/19/2018 10:22 AM   Modules accepted: Orders

## 2018-12-19 NOTE — Telephone Encounter (Signed)
Patient informed to start lasix 20 mg daily per Dr. Agustin Cree and follow up as scheduled. No further questions.

## 2018-12-30 ENCOUNTER — Ambulatory Visit (INDEPENDENT_AMBULATORY_CARE_PROVIDER_SITE_OTHER): Payer: PRIVATE HEALTH INSURANCE | Admitting: Cardiology

## 2018-12-30 ENCOUNTER — Encounter: Payer: Self-pay | Admitting: Cardiology

## 2018-12-30 ENCOUNTER — Other Ambulatory Visit: Payer: Self-pay

## 2018-12-30 VITALS — BP 142/70 | HR 71 | Ht 73.5 in | Wt 267.0 lb

## 2018-12-30 DIAGNOSIS — G4733 Obstructive sleep apnea (adult) (pediatric): Secondary | ICD-10-CM

## 2018-12-30 DIAGNOSIS — R0789 Other chest pain: Secondary | ICD-10-CM | POA: Diagnosis not present

## 2018-12-30 DIAGNOSIS — R0609 Other forms of dyspnea: Secondary | ICD-10-CM

## 2018-12-30 DIAGNOSIS — E782 Mixed hyperlipidemia: Secondary | ICD-10-CM | POA: Diagnosis not present

## 2018-12-30 DIAGNOSIS — I10 Essential (primary) hypertension: Secondary | ICD-10-CM

## 2018-12-30 NOTE — Progress Notes (Signed)
Cardiology Office Note:    Date:  12/30/2018   ID:  Real ConsJesse Berry, DOB 10/10/1980, MRN [id number]  PCP:  Malva CoganMitchell, Merdith T, FNP  Cardiologist:  Gypsy Balsamobert Ayianna Darnold, MD    Referring MD: Nonnie DoneSlatosky, John J., MD   Chief Complaint  Patient presents with  . Follow-up  I still do not feel good  History of Present Illness:    Real ConsJesse Berry is a 38 y.o. male with chief complaint of shortness of breath.  Quite extensive cardiac evaluation has been done which included stress test as well as echocardiogram.  That was done at the end of last year all came good.  However he shortness of breath persists.  He said he started feeling better slightly but now again started having problems.  On top of that he tells me that he does have some palpitations he can hear he can feel his heart stopping and then spitting up for a moment. Denies having chest pain tightness squeezing pressure been chest but described to have some problem with his back. Just last weeks [place] he was hoping that he got difficulty keeping up with his wife.  Past Medical History:  Diagnosis Date  . Hyperlipidemia   . Hypertension     Past Surgical History:  Procedure Laterality Date  . NO PAST SURGERIES      Current Medications: Current Meds  Medication Sig  . amLODipine (NORVASC) 10 MG tablet Take 10 tablets by mouth daily.   . furosemide (LASIX) 20 MG tablet Take 1 tablet (20 mg total) by mouth daily.  Marland Kitchen. omeprazole (PRILOSEC) 20 MG capsule   . SYMBICORT 160-4.5 MCG/ACT inhaler   . telmisartan-hydrochlorothiazide (MICARDIS HCT) 40-12.5 MG tablet Take 1 tablet by mouth daily.     Allergies:   Levaquin [levofloxacin]   Social History   Socioeconomic History  . Marital status: Married    Spouse name: Not on file  . Number of children: Not on file  . Years of education: Not on file  . Highest education level: Not on file  Occupational History  . Not on file  Social Needs  . Financial  resource strain: Not on file  . Food insecurity    Worry: Not on file    Inability: Not on file  . Transportation needs    Medical: Not on file    Non-medical: Not on file  Tobacco Use  . Smoking status: Never Smoker  . Smokeless tobacco: Former NeurosurgeonUser    Types: Chew  Substance and Sexual Activity  . Alcohol use: Never    Frequency: Never  . Drug use: Never  . Sexual activity: Not on file  Lifestyle  . Physical activity    Days per week: Not on file    Minutes per session: Not on file  . Stress: Not on file  Relationships  . Social Musicianconnections    Talks on phone: Not on file    Gets together: Not on file    Attends religious service: Not on file    Active member of club or organization: Not on file    Attends meetings of clubs or organizations: Not on file    Relationship status: Not on file  Other Topics Concern  . Not on file  Social History Narrative  . Not on file     Family History: The patient's family history includes Healthy in his mother; Hyperlipidemia in his father. ROS:   Please see the history of present illness.  All 14 point review of systems negative except as described per history of present illness  EKGs/Labs/Other Studies Reviewed:      Recent Labs: 11/11/2018: BUN 11; Creatinine, Ser 0.99; Hemoglobin 16.0; Platelets 193.0; Potassium 4.0; Sodium 138  Recent Lipid Panel No results found for: CHOL, TRIG, HDL, CHOLHDL, VLDL, LDLCALC, LDLDIRECT  Physical Exam:    VS:  BP (!) 142/70   Pulse 71   Ht 6' 1.5" (1.867 m)   Wt 267 lb (121.1 kg)   SpO2 98%   BMI 34.75 kg/m     Wt Readings from Last 3 Encounters:  12/30/18 267 lb (121.1 kg)  11/11/18 261 lb 8 oz (118.6 kg)  10/24/18 256 lb (116.1 kg)     GEN:  Well nourished, well developed in no acute distress HEENT: Normal NECK: No JVD; No carotid bruits LYMPHATICS: No lymphadenopathy CARDIAC: RRR, no murmurs, no rubs, no gallops RESPIRATORY:  Clear to auscultation without rales, wheezing  or rhonchi  ABDOMEN: Soft, non-tender, non-distended MUSCULOSKELETAL:  No edema; No deformity  SKIN: Warm and dry LOWER EXTREMITIES: no swelling NEUROLOGIC:  Alert and oriented x 3 PSYCHIATRIC:  Normal affect   ASSESSMENT:    1. Dyspnea on exertion   2. Atypical chest pain   3. Mixed dyslipidemia   4. Obstructive sleep apnea   5. Essential hypertension    PLAN:    In order of problems listed above:  1. Dyspnea on exertion so far cardiac work-up negative.  He is being followed by pulmonary.  We will try to make arrangements for pulmonary function test for him.  Apparently he was told that cannot be done until October of this year I suspect this is because of coronavirus restriction. 2. Atypical chest pain denies having any less stress test negative 3. Mixed dyslipidemia we will continue present management 4. Palpitations I will ask him to wear 0 patch 5. Essential hypertension blood pressure still on the low but higher side with some changes in his medication including increasing dose of amlodipine and he noted some swelling of lower extremities at evening time.  Also his metoprolol has been discontinued which may be contributing to his increased extrasystole.   Medication Adjustments/Labs and Tests Ordered: Current medicines are reviewed at length with the patient today.  Concerns regarding medicines are outlined above.  No orders of the defined types were placed in this encounter.  Medication changes: No orders of the defined types were placed in this encounter.   Signed, Park Liter, MD, [facility] 12/30/2018 3:42 PM    [place]

## 2018-12-30 NOTE — Addendum Note (Signed)
Addended by: Ashok Norris on: 12/30/2018 04:07 PM   Modules accepted: Orders

## 2018-12-30 NOTE — Patient Instructions (Signed)
Medication Instructions:  Your physician recommends that you continue on your current medications as directed. Please refer to the Current Medication list given to you today.  If you need a refill on your cardiac medications before your next appointment, please call your pharmacy.   Lab work: None.  If you have labs (blood work) drawn today and your tests are completely normal, you will receive your results only by: . MyChart Message (if you have MyChart) OR . A paper copy in the mail If you have any lab test that is abnormal or we need to change your treatment, we will call you to review the results.  Testing/Procedures: Your physician has recommended that you wear a holter monitor. Holter monitors are medical devices that record the heart's electrical activity. Doctors most often use these monitors to diagnose arrhythmias. Arrhythmias are problems with the speed or rhythm of the heartbeat. The monitor is a small, portable device. You can wear one while you do your normal daily activities. This is usually used to diagnose what is causing palpitations/syncope (passing out). Wear for 7 days.     Follow-Up: At [facility], you and your health needs are our priority.  As part of our continuing mission to provide you with exceptional heart care, we have created designated Provider Care Teams.  These Care Teams include your primary Cardiologist (physician) and Advanced Practice Providers (APPs -  Physician Assistants and Nurse Practitioners) who all work together to provide you with the care you need, when you need it. You will need a follow up appointment in 2 months.  Please call our office 2 months in advance to schedule this appointment.  You may see No primary care provider on file. or another member of our [facility] Provider Team in [place]: Brian Munley, MD . Rajan Revankar, MD  Any Other Special Instructions Will Be Listed Below (If Applicable).   

## 2019-01-07 ENCOUNTER — Other Ambulatory Visit (INDEPENDENT_AMBULATORY_CARE_PROVIDER_SITE_OTHER): Payer: PRIVATE HEALTH INSURANCE

## 2019-01-07 DIAGNOSIS — R0789 Other chest pain: Secondary | ICD-10-CM | POA: Diagnosis not present

## 2019-01-07 DIAGNOSIS — R0609 Other forms of dyspnea: Secondary | ICD-10-CM | POA: Diagnosis not present

## 2019-01-09 ENCOUNTER — Telehealth: Payer: Self-pay | Admitting: Internal Medicine

## 2019-01-09 NOTE — Telephone Encounter (Signed)
I have not been contacted yet, but fine with me to move up scheduled PFT to be done here or at [facility] or [facility] as soon as available

## 2019-01-09 NOTE — Telephone Encounter (Signed)
Spoke with pt's wife, Amy. She is aware of CY's response. They want to go ahead and move up the pt's PFT, they would prefer to have it done here and NOT at the hospital. Will route message to [facility] to arrange this.

## 2019-01-09 NOTE — Telephone Encounter (Signed)
Dr. Annamaria Boots, please see the message below. Sent to you and Raquel Sarna as an [place].

## 2019-01-10 NOTE — Telephone Encounter (Signed)
Called Helene Kelp but there was no answer. I left a voicemail to have her call us back.

## 2019-01-13 ENCOUNTER — Ambulatory Visit: Payer: PRIVATE HEALTH INSURANCE | Admitting: Cardiology

## 2019-01-13 NOTE — Telephone Encounter (Signed)
Pt has been scheduled to have PFT 10/19 followed by OV with CY. Nothing further needed.

## 2019-01-17 ENCOUNTER — Encounter: Payer: Self-pay | Admitting: Internal Medicine

## 2019-01-17 NOTE — Telephone Encounter (Signed)
Error

## 2019-01-20 ENCOUNTER — Other Ambulatory Visit: Payer: Self-pay | Admitting: Internal Medicine

## 2019-01-23 ENCOUNTER — Telehealth: Payer: Self-pay | Admitting: Emergency Medicine

## 2019-01-23 NOTE — Telephone Encounter (Signed)
Left message for patient to return call regarding monitor results.  

## 2019-01-24 NOTE — Telephone Encounter (Signed)
Called patient he reports he pressed the button at 2 am due to waking up feeling numbness/tingling in his hands. Will informed Dr. Agustin Cree and follow up with the patient.

## 2019-01-28 ENCOUNTER — Other Ambulatory Visit (HOSPITAL_COMMUNITY)
Admission: RE | Admit: 2019-01-28 | Discharge: 2019-01-28 | Disposition: A | Discharge status: Home / Self Care | Payer: Commercial Managed Care - PPO | Source: Ambulatory Visit | Attending: Internal Medicine | Admitting: Internal Medicine

## 2019-01-28 DIAGNOSIS — Z01812 Encounter for preprocedural laboratory examination: Secondary | ICD-10-CM | POA: Insufficient documentation

## 2019-01-28 DIAGNOSIS — Z20828 Contact with and (suspected) exposure to other viral communicable diseases: Secondary | ICD-10-CM | POA: Insufficient documentation

## 2019-01-28 LAB — SARS CORONAVIRUS 2 (TAT 6-24 HRS): SARS Coronavirus 2: NEGATIVE

## 2019-01-28 NOTE — Telephone Encounter (Signed)
Patient informed to follow up on September 3rd, 2020. No further questions. He is aware of appointment.

## 2019-01-30 ENCOUNTER — Encounter: Payer: Self-pay | Admitting: Cardiology

## 2019-01-30 ENCOUNTER — Telehealth: Payer: Self-pay | Admitting: Emergency Medicine

## 2019-01-30 ENCOUNTER — Ambulatory Visit (INDEPENDENT_AMBULATORY_CARE_PROVIDER_SITE_OTHER): Payer: PRIVATE HEALTH INSURANCE | Admitting: Cardiology

## 2019-01-30 ENCOUNTER — Other Ambulatory Visit: Payer: Self-pay

## 2019-01-30 VITALS — BP 112/70 | HR 55 | Ht 73.5 in | Wt 259.4 lb

## 2019-01-30 DIAGNOSIS — R0789 Other chest pain: Secondary | ICD-10-CM | POA: Diagnosis not present

## 2019-01-30 DIAGNOSIS — G4733 Obstructive sleep apnea (adult) (pediatric): Secondary | ICD-10-CM

## 2019-01-30 DIAGNOSIS — I10 Essential (primary) hypertension: Secondary | ICD-10-CM

## 2019-01-30 DIAGNOSIS — R0609 Other forms of dyspnea: Secondary | ICD-10-CM | POA: Diagnosis not present

## 2019-01-30 MED ORDER — AMLODIPINE BESYLATE 5 MG PO TABS: 5.0000 mg | ORAL_TABLET | Freq: Every day | ORAL | 1 refills | Status: AC

## 2019-01-30 NOTE — Patient Instructions (Signed)
Medication Instructions:  Your physician has recommended you make the following change in your medication:  DECREASE You Amlodipine to half a tablet (5 mg) daily.  If you need a refill on your cardiac medications before your next appointment, please call your pharmacy.   Lab work:  If you have labs (blood work) drawn today and your tests are completely normal, you will receive your results only by: Marland Kitchen MyChart Message (if you have MyChart) OR . A paper copy in the mail If you have any lab test that is abnormal or we need to change your treatment, we will call you to review the results.  Testing/Procedures: Your physician has requested that you have an exercise tolerance test. For further information please visit [email or web address]. Please also follow instruction sheet, as given.  Follow-Up: At [facility], you and your health needs are our priority.  As part of our continuing mission to provide you with exceptional heart care, we have created designated Provider Care Teams.  These Care Teams include your primary Cardiologist (physician) and Advanced Practice Providers (APPs -  Physician Assistants and Nurse Practitioners) who all work together to provide you with the care you need, when you need it. You will need a follow up appointment in 6 weeks. You may see  Jenne Campus or another member of our [employer] Provider Team in [place]: Shirlee More, MD . Jyl Heinz, MD  Any Other Special Instructions Will Be Listed Below (If Applicable).     [facility] [address] [place], [place] [address] Phone:  [phone]  January 30, 2019      Talor Cheema DOB: Mar 22, 1981 MRN: [id number] [address] [place] [address]   Dear Mr. Javid,   You are scheduled for an Exercise Stress Test on  at   Please arrive 15 minutes prior to your appointment time for registration and insurance purposes.  The test will take  approximately 45 minutes to complete.  How to prepare for your Exercise Stress Test: Do bring a list of your current medications with you.  If not listed below, you may take your medications as normal. . Do not take Furosemide the morning of  the test.  Bring the medication to your appointment as you may be required to take it once the test is complete. . Do wear comfortable clothes (no dresses or overalls) and walking shoes, tennis shoes preferred (no heels or open toed shoes are allowed) . Do Not wear cologne, perfume, aftershave or lotions (deodorant is allowed). . Please report to [place], [address] for your test.  If these instructions are not followed, your test will have to be rescheduled.  If you have questions or concerns about your appointment, you can call the Stress Lab at [phone].  If you cannot keep your appointment, please provide 24 hours notification to the Stress Lab, to avoid a possible $50 charge to your account.

## 2019-01-30 NOTE — Progress Notes (Signed)
Cardiology Office Note:    Date:  01/30/2019   ID:  Martin Berry, DOB [birth year], MRN [id number]  PCP:  Noreene Larsson, FNP  Cardiologist:  Jenne Campus, MD    Referring MD: Noreene Larsson, FNP   Chief Complaint  Patient presents with  . Follow-up    History of Present Illness:    Martin Berry is a 38 y.o. male who was referred to [place] because of atypical chest pain.  Numerous tests were done so far negative.  He does have complain of being weak tired and fatigued.  We eventually end up putting monitor on him interesting monitor showed heart rate 31 during the night but he pressed the button when it happened.  There are multiple episodes his heart rate slows down during the night which is not surprising but one time he trigger event.  When asking him today what really happened at night he does not remember exactly but he thinks he did have some tingling in his fingers.  Also the fastest heart rate was not as high as I would expect some mildly brought him.  He described also episodes of being weak tired and fatigued.  That happen especially during the day after he ate some heavy lunch.  For now I asked him to check his blood pressure especially he feels poorly I would like to know what his blood pressure and heart rate is.  His blood pressure is low today.  I asked him to cut down amlodipine in half.  He will take only 5 mg daily.  Past Medical History:  Diagnosis Date  . Hyperlipidemia   . Hypertension     Past Surgical History:  Procedure Laterality Date  . NO PAST SURGERIES      Current Medications: Current Meds  Medication Sig  . amLODipine (NORVASC) 10 MG tablet Take 10 tablets by mouth daily.   . furosemide (LASIX) 20 MG tablet Take 1 tablet (20 mg total) by mouth daily.  Marland Kitchen omeprazole (PRILOSEC) 20 MG capsule   . SYMBICORT 160-4.5 MCG/ACT inhaler   . telmisartan-hydrochlorothiazide (MICARDIS HCT) 40-12.5 MG tablet Take 1 tablet by mouth daily.     Allergies:    Levaquin [levofloxacin]   Social History   Socioeconomic History  . Marital status: Married    Spouse name: Not on file  . Number of children: Not on file  . Years of education: Not on file  . Highest education level: Not on file  Occupational History  . Not on file  Social Needs  . Financial resource strain: Not on file  . Food insecurity    Worry: Not on file    Inability: Not on file  . Transportation needs    Medical: Not on file    Non-medical: Not on file  Tobacco Use  . Smoking status: Never Smoker  . Smokeless tobacco: Former Systems developer    Types: Chew  Substance and Sexual Activity  . Alcohol use: Never    Frequency: Never  . Drug use: Never  . Sexual activity: Not on file  Lifestyle  . Physical activity    Days per week: Not on file    Minutes per session: Not on file  . Stress: Not on file  Relationships  . Social Herbalist on phone: Not on file    Gets together: Not on file    Attends religious service: Not on file    Active member of club or organization: Not on file  Attends meetings of clubs or organizations: Not on file    Relationship status: Not on file  Other Topics Concern  . Not on file  Social History Narrative  . Not on file     Family History: The patient's family history includes Healthy in his mother; Hyperlipidemia in his father. ROS:   Please see the history of present illness.    All 14 point review of systems negative except as described per history of present illness  EKGs/Labs/Other Studies Reviewed:      Recent Labs: 11/11/2018: BUN 11; Creatinine, Ser 0.99; Hemoglobin 16.0; Platelets 193.0; Potassium 4.0; Sodium 138  Recent Lipid Panel No results found for: CHOL, TRIG, HDL, CHOLHDL, VLDL, LDLCALC, LDLDIRECT  Physical Exam:    VS:  BP 112/70   Pulse (!) 55   Ht 6' 1.5" (1.867 m)   Wt 259 lb 6.4 oz (117.7 kg)   SpO2 98%   BMI 33.76 kg/m     Wt Readings from Last 3 Encounters:  01/30/19 259 lb 6.4 oz (117.7  kg)  12/30/18 267 lb (121.1 kg)  11/11/18 261 lb 8 oz (118.6 kg)     GEN:  Well nourished, well developed in no acute distress HEENT: Normal NECK: No JVD; No carotid bruits LYMPHATICS: No lymphadenopathy CARDIAC: RRR, no murmurs, no rubs, no gallops RESPIRATORY:  Clear to auscultation without rales, wheezing or rhonchi  ABDOMEN: Soft, non-tender, non-distended MUSCULOSKELETAL:  No edema; No deformity  SKIN: Warm and dry LOWER EXTREMITIES: no swelling NEUROLOGIC:  Alert and oriented x 3 PSYCHIATRIC:  Normal affect   ASSESSMENT:    1. Atypical chest pain   2. Dyspnea on exertion   3. Essential hypertension   4. Obstructive sleep apnea    PLAN:    In order of problems listed above:  1. Atypical chest pain stress test negative. 2. Bradycardia noted on the monitor during the night however he trigger event and we need to try to better assess his sinus node and we can do it by exercising him.  I will schedule him to have plain treadmill EKG stress test to look for chronotropic response. Essential hypertension blood pressure actually is low and I will cut down amlodipine Obstructive sleep apnea being followed by internal medicine team.  Overall is a very complicated situation.  Will get stress test.  He will check his blood pressure I am worried that there may be some psychological issue here as well.   Medication Adjustments/Labs and Tests Ordered: Current medicines are reviewed at length with the patient today.  Concerns regarding medicines are outlined above.  No orders of the defined types were placed in this encounter.  Medication changes: No orders of the defined types were placed in this encounter.   Signed, Georgeanna Leaobert J. Stiven Kaspar, MD, [facility] Community HospitalFACC 01/30/2019 11:12 AM    [facility]

## 2019-01-30 NOTE — Telephone Encounter (Signed)
Called patient and got him scheduled for covid testing before his gxt. He verbally understood. No further questions.

## 2019-01-30 NOTE — Addendum Note (Signed)
Addended by: Ashok Norris on: 01/30/2019 11:47 AM   Modules accepted: Orders

## 2019-01-31 ENCOUNTER — Ambulatory Visit: Payer: PRIVATE HEALTH INSURANCE | Admitting: Internal Medicine

## 2019-01-31 DIAGNOSIS — R0609 Other forms of dyspnea: Secondary | ICD-10-CM

## 2019-01-31 LAB — PULMONARY FUNCTION TEST
DL/VA % pred: 117 %
DL/VA: 5.38 ml/min/mmHg/L
DLCO unc % pred: 112 %
DLCO unc: 40.68 ml/min/mmHg
FEF 25-75 Post: 4.25 L/sec
FEF 25-75 Pre: 4.01 L/sec
FEF2575-%Change-Post: 5 %
FEF2575-%Pred-Post: 94 %
FEF2575-%Pred-Pre: 88 %
FEV1-%Change-Post: 1 %
FEV1-%Pred-Post: 93 %
FEV1-%Pred-Pre: 91 %
FEV1-Post: 4.58 L
FEV1-Pre: 4.51 L
FEV1FVC-%Change-Post: 0 %
FEV1FVC-%Pred-Pre: 99 %
FEV6-%Change-Post: 2 %
FEV6-%Pred-Post: 96 %
FEV6-%Pred-Pre: 93 %
FEV6-Post: 5.8 L
FEV6-Pre: 5.68 L
FEV6FVC-%Change-Post: 0 %
FEV6FVC-%Pred-Post: 102 %
FEV6FVC-%Pred-Pre: 102 %
FVC-%Change-Post: 2 %
FVC-%Pred-Post: 93 %
FVC-%Pred-Pre: 91 %
FVC-Post: 5.81 L
FVC-Pre: 5.69 L
Post FEV1/FVC ratio: 79 %
Post FEV6/FVC ratio: 100 %
Pre FEV1/FVC ratio: 79 %
Pre FEV6/FVC Ratio: 100 %
RV % pred: 99 %
RV: 2.08 L
TLC % pred: 99 %
TLC: 7.93 L

## 2019-01-31 NOTE — Progress Notes (Signed)
PFT done today. 

## 2019-02-11 ENCOUNTER — Other Ambulatory Visit (HOSPITAL_COMMUNITY)
Admission: RE | Admit: 2019-02-11 | Discharge: 2019-02-11 | Disposition: A | Discharge status: Home / Self Care | Payer: PRIVATE HEALTH INSURANCE | Source: Ambulatory Visit | Attending: Cardiology | Admitting: Cardiology

## 2019-02-11 ENCOUNTER — Telehealth: Payer: Self-pay | Admitting: Internal Medicine

## 2019-02-11 DIAGNOSIS — Z01812 Encounter for preprocedural laboratory examination: Secondary | ICD-10-CM | POA: Insufficient documentation

## 2019-02-11 DIAGNOSIS — Z20828 Contact with and (suspected) exposure to other viral communicable diseases: Secondary | ICD-10-CM | POA: Diagnosis not present

## 2019-02-11 NOTE — Telephone Encounter (Signed)
Pulmonary Function Test was within normal limits. I can go over it with him at upcoming appoinment

## 2019-02-11 NOTE — Telephone Encounter (Signed)
Pt spouse, Amy (on DPR), called on behalf of pt to receive results from PFT performed on 01/31/2019.   CY, please advise with your results and recommendations. Let me know if we need to make an office visit. Pt's next appt is scheduled for 03/17/2019 at 10:00 AM. Thank you.

## 2019-02-12 ENCOUNTER — Telehealth (HOSPITAL_COMMUNITY): Payer: Self-pay

## 2019-02-12 LAB — NOVEL CORONAVIRUS, NAA (HOSP ORDER, SEND-OUT TO REF LAB; TAT 18-24 HRS): SARS-CoV-2, NAA: NOT DETECTED

## 2019-02-12 NOTE — Telephone Encounter (Signed)
Called and spoke with pt's spouse, Amy (on [place]), regarding CY's results and recommendations. Amy verbalized understanding with no additional questions or concerns. Pt's f/u appt is scheduled for 03/17/2019 at 10:00 AM EDT. Nothing further needed at this time.

## 2019-02-12 NOTE — Telephone Encounter (Signed)
Encounter complete. 

## 2019-02-14 ENCOUNTER — Ambulatory Visit (HOSPITAL_COMMUNITY)
Admission: RE | Admit: 2019-02-14 | Discharge: 2019-02-14 | Disposition: A | Discharge status: Home / Self Care | Payer: PRIVATE HEALTH INSURANCE | Source: Ambulatory Visit | Attending: Cardiology | Admitting: Cardiology

## 2019-02-14 ENCOUNTER — Other Ambulatory Visit: Payer: Self-pay

## 2019-02-14 DIAGNOSIS — R0789 Other chest pain: Secondary | ICD-10-CM | POA: Insufficient documentation

## 2019-02-16 LAB — EXERCISE TOLERANCE TEST
Estimated workload: 12.1 METS
Exercise duration (min): 10 min
Exercise duration (sec): 13 s
MPHR: 181 {beats}/min
Peak HR: 164 {beats}/min
Percent HR: 90 %
Rest HR: 70 {beats}/min

## 2019-02-18 ENCOUNTER — Encounter: Payer: Self-pay | Admitting: Family

## 2019-02-18 DIAGNOSIS — G4733 Obstructive sleep apnea (adult) (pediatric): Secondary | ICD-10-CM | POA: Insufficient documentation

## 2019-02-18 DIAGNOSIS — Z9989 Dependence on other enabling machines and devices: Secondary | ICD-10-CM | POA: Insufficient documentation

## 2019-02-18 DIAGNOSIS — I493 Ventricular premature depolarization: Secondary | ICD-10-CM | POA: Insufficient documentation

## 2019-02-18 DIAGNOSIS — R0609 Other forms of dyspnea: Secondary | ICD-10-CM | POA: Insufficient documentation

## 2019-02-18 DIAGNOSIS — I10 Essential (primary) hypertension: Secondary | ICD-10-CM | POA: Insufficient documentation

## 2019-02-18 DIAGNOSIS — I491 Atrial premature depolarization: Secondary | ICD-10-CM | POA: Insufficient documentation

## 2019-02-18 DIAGNOSIS — R002 Palpitations: Secondary | ICD-10-CM | POA: Insufficient documentation

## 2019-02-18 DIAGNOSIS — E785 Hyperlipidemia, unspecified: Secondary | ICD-10-CM | POA: Insufficient documentation

## 2019-02-18 NOTE — Progress Notes (Deleted)
Office Visit    Patient Name: Martin Berry Date of Encounter: 02/19/2019  Primary Care Provider:  Leilani Able, FNP Primary Cardiologist:  Jenne Campus, MD Electrophysiologist:  None   Chief Complaint    Martin Berry is a 38 y.o. male with a hx of *** presents today for follow up after exercise tolerance test   Past Medical History    Past Medical History:  Diagnosis Date  . DOE (dyspnea on exertion)   . GERD (gastroesophageal reflux disease)   . Hyperlipidemia   . Hypertension   . OSA on CPAP   . PAC (premature atrial contraction)   . Palpitations   . PVC (premature ventricular contraction)    Past Surgical History:  Procedure Laterality Date  . NO PAST SURGERIES      Allergies  Allergies  Allergen Reactions  . Levaquin [Levofloxacin] Rash    Pt states he gets SOB & rash     History of Present Illness    Martin Berry is a 38 y.o. male with a hx of HTN, HLD, GERD, OSA on CPAP, atypical chest pain, fatigue last seen 01/30/19 by Dr. Agustin Cree. A ZIO 01/20/19 done for palpitations showed SR, infrequent PACs, infrequent PVCs, maximum HR 153. Noted minimum HR 31 bpm at night which was a triggered event. He reported that he pressed the button at 2am due to waking with numbness/tingling in his hands. An ETT (exercise tolerance test) was done to assess chronotropic response. ETT 02/16/19 showed  Horizontal ST depressions progressive with exercise in II/III/aVF and horizontal downsloping depression in V6. His peak HR was 164 and peak BP 162/72.   Follows with pulmonology Dr. Annamaria Boots and recent normal PFTs.   EKGs/Labs/Other Studies Reviewed:   The following studies were reviewed today: Long term monitor 01/20/19 Baseline rhythm: Sinus   Minimum heart rate: 31 BPM.  Average heart rate: 62 BPM.  Maximal heart rate 153 BPM.   Atrial arrhythmia: Infrequent PACs, 2 runs of narrow complex tachycardia.  Fastest episode 5 beats at rate of 118, longest episode 6 beats    Ventricular arrhythmia: Infrequent PVCs   Conduction abnormality: None   Symptoms: Trigger events at 05/31/2016 morning while patient was in significant sinus bradycardia however no symptoms described   Conclusion:  Bradycardia during the night is approximately triggered by patient is a symptomatic.   Interpreting  cardiologist: Jenne Campus, MD  Date: 01/20/2019 6:43 PM  Exercise Tolerance Test 02/14/19  Horizontal ST segment depression ST segment depression of 2 mm was noted during stress in the II, III, aVF and V6 leads, and returning to baseline after 1-5 minutes of recovery.   Positive adequate stress test. Horizontal ST depressions that were progressive with exercise in II/III/aVf with horizontal to downsloping depressions in V6.  EKG:  EKG is ordered today.  The ekg ordered today demonstrates ***  Recent Labs: 11/11/2018: BUN 11; Creatinine, Ser 0.99; Hemoglobin 16.0; Platelets 193.0; Potassium 4.0; Sodium 138  Recent Lipid Panel No results found for: CHOL, TRIG, HDL, CHOLHDL, VLDL, LDLCALC, LDLDIRECT  Home Medications   No outpatient medications have been marked as taking for the 02/19/19 encounter (Appointment) with Loel Dubonnet, NP.      Review of Systems      ROS All other systems reviewed and are otherwise negative except as noted above.  Physical Exam    VS:  There were no vitals taken for this visit. , BMI There is no height or weight on file to calculate BMI.  GEN: Well nourished, well developed, in no acute distress. HEENT: normal. Neck: Supple, no JVD, carotid bruits, or masses. Cardiac: ***RRR, no murmurs, rubs, or gallops. No clubbing, cyanosis, edema.  ***Radials/DP/PT 2+ and equal bilaterally.  Respiratory:  ***Respirations regular and unlabored, clear to auscultation bilaterally. GI: Soft, nontender, nondistended, BS + x 4. MS: No deformity or atrophy. Skin: Warm and dry, no rash. Neuro:  Strength and sensation are intact. Psych: Normal affect.   Accessory Clinical Findings    ECG personally reviewed by me today - *** - no acute changes.  Assessment & Plan    1. Abnormal exercise tolerance test - ETT 02/14/19 with ST depression progressive with exercise in II/III/aVf and horizontal downsloping depression in V6.  2. DOE -  3. HTN -  4. HLD -  5. OSA on CPAP - Follows with pulmonology 6. Palpitations - ZIO 11/2018 with infrequent PVC/PAC.  Disposition: Follow up {follow up:[id number]} with Dr. Darcus Pester, NP 02/19/2019, 7:51 AM

## 2019-02-19 ENCOUNTER — Ambulatory Visit (INDEPENDENT_AMBULATORY_CARE_PROVIDER_SITE_OTHER): Payer: PRIVATE HEALTH INSURANCE | Admitting: Family

## 2019-02-19 ENCOUNTER — Other Ambulatory Visit: Payer: Self-pay

## 2019-02-19 ENCOUNTER — Encounter: Payer: Self-pay | Admitting: Family

## 2019-02-19 VITALS — BP 138/82 | HR 50 | Temp 97.7°F | Ht 75.0 in | Wt 260.5 lb

## 2019-02-19 DIAGNOSIS — R072 Precordial pain: Secondary | ICD-10-CM

## 2019-02-19 DIAGNOSIS — I10 Essential (primary) hypertension: Secondary | ICD-10-CM

## 2019-02-19 DIAGNOSIS — R9431 Abnormal electrocardiogram [ECG] [EKG]: Secondary | ICD-10-CM

## 2019-02-19 DIAGNOSIS — R9430 Abnormal result of cardiovascular function study, unspecified: Secondary | ICD-10-CM | POA: Diagnosis not present

## 2019-02-19 DIAGNOSIS — G4733 Obstructive sleep apnea (adult) (pediatric): Secondary | ICD-10-CM | POA: Diagnosis not present

## 2019-02-19 DIAGNOSIS — R0609 Other forms of dyspnea: Secondary | ICD-10-CM

## 2019-02-19 DIAGNOSIS — E782 Mixed hyperlipidemia: Secondary | ICD-10-CM | POA: Diagnosis not present

## 2019-02-19 DIAGNOSIS — R002 Palpitations: Secondary | ICD-10-CM

## 2019-02-19 DIAGNOSIS — Z9989 Dependence on other enabling machines and devices: Secondary | ICD-10-CM

## 2019-02-19 DIAGNOSIS — Z01812 Encounter for preprocedural laboratory examination: Secondary | ICD-10-CM

## 2019-02-19 NOTE — Patient Instructions (Addendum)
Medication Instructions:  No medication changes today.   If you need a refill on your cardiac medications before your next appointment, please call your pharmacy.   Lab work: Your physician recommends that you return for lab work 3-7 days prior to cardiac CTA: BMP  If you have labs (blood work) drawn today and your tests are completely normal, you will receive your results only by: Marland Kitchen MyChart Message (if you have MyChart) OR . A paper copy in the mail If you have any lab test that is abnormal or we need to change your treatment, we will call you to review the results.  Testing/Procedures: Your physician has requested that you have cardiac CT. Cardiac computed tomography (CT) is a painless test that uses an x-ray machine to take clear, detailed pictures of your heart. For further information please visit [email or web address] Please follow instruction sheet as given.  Follow-Up: At [facility], you and your health needs are our priority.  As part of our continuing mission to provide you with exceptional heart care, we have created designated Provider Care Teams.  These Care Teams include your primary Cardiologist (physician) and Advanced Practice Providers (APPs -  Physician Assistants and Nurse Practitioners) who all work together to provide you with the care you need, when you need it. You have a follow up appointment in 1 months with Gypsy Balsam, MD   Any Other Special Instructions Will Be Listed Below (If Applicable).   Your cardiac CT will be scheduled at:   [facility] [address] [place], [place] [address] [phone]  Please arrive at the [facility] main entrance of [facility] 30-45 minutes prior to test start time. Proceed to the [facility] Radiology Department (first floor) to check-in and test prep.  Please follow these instructions carefully (unless otherwise directed):  Hold all erectile dysfunction medications at least 3 days (72 hrs)  prior to test.  On the Night Before the Test: . Be sure to Drink plenty of water. . Do not consume any caffeinated/decaffeinated beverages or chocolate 12 hours prior to your test. . Do not take any antihistamines 12 hours prior to your test.  On the Day of the Test: . Drink plenty of water. Do not drink any water within one hour of the test. . Do not eat any food 4 hours prior to the test. . You may take your regular medications prior to the test.  . HOLD Telmisartan-HCTZ (Micardis) morning of the test.      After the Test: . Drink plenty of water. . After receiving IV contrast, you may experience a mild flushed feeling. This is normal. . On occasion, you may experience a mild rash up to 24 hours after the test. This is not dangerous. If this occurs, you can take Benadryl 25 mg and increase your fluid intake. . If you experience trouble breathing, this can be serious. If it is severe call 911 IMMEDIATELY. If it is mild, please call our office. . If you take any of these medications: Glipizide/Metformin, Avandament, Glucavance, please do not take 48 hours after completing test unless otherwise instructed.  Please contact the cardiac imaging nurse navigator should you have any questions/concerns Rockwell Alexandria, RN Navigator Cardiac Imaging [facility] [phone] Office  [phone] Cell   Rosuvastatin capsules What is this medicine? ROSUVASTATIN (roe SOO va sta tin) is known as an HMG-CoA reductase inhibitor or 'statin'. It lowers cholesterol and triglycerides in the blood. Diet and lifestyle  changes are often used with this drug. This medicine may be used for other purposes; ask your health care provider or pharmacist if you have questions. COMMON BRAND NAME(S): Ezallor What should I tell my health care provider before I take this medicine? They need to know if you have any of these conditions:  diabetes  if you often drink alcohol  history of  stroke  kidney disease  liver disease  muscle aches or weakness  thyroid disease  an unusual or allergic reaction to rosuvastatin, other medicines, foods, dyes, or preservatives  pregnant or trying to get pregnant  breast-feeding How should I use this medicine? Take this medicine by mouth with a glass of water. Follow the directions on the prescription label. Swallow the capsules whole. Do not crush or chew this medicine. You may open the capsule and put the contents in 1 teaspoon of applesauce, chocolate pudding, or vanilla pudding. Swallow the medicine with applesauce or pudding within 60 minutes (1 hour). Do not chew the medicine, applesauce, or pudding. You can take this medicine with or without food. Take your doses at regular intervals. Do not take your medicine more often than directed. Talk to your pediatrician about the use of this medicine in children. Special care may be needed. Overdosage: If you think you have taken too much of this medicine contact a poison control center or emergency room at once. NOTE: This medicine is only for you. Do not share this medicine with others. What if I miss a dose? If you miss a dose, take it as soon as you can. If your next dose is to be taken in less than 12 hours, then do not take the missed dose. Take the next dose at your regular time. Do not take double or extra doses. What may interact with this medicine? Do not take this medicine with any of the following medications:  herbal medicines like red yeast rice This medicine may also interact with the following medications:  alcohol  antacids containing aluminum hydroxide or magnesium hydroxide  cyclosporine  other medicines for high cholesterol  some medicines for HIV infection  warfarin This list may not describe all possible interactions. Give your health care provider a list of all the medicines, herbs, non-prescription drugs, or dietary supplements you use. Also tell them if  you smoke, drink alcohol, or use illegal drugs. Some items may interact with your medicine. What should I watch for while using this medicine? Visit your doctor or health care professional for regular check-ups. You may need regular tests to make sure your liver is working properly. Your health care professional may tell you to stop taking this medicine if you develop muscle problems. If your muscle problems do not go away after stopping this medicine, contact your health care professional. Do not become pregnant while taking this medicine. Women should inform their health care professional if they wish to become pregnant or think they might be pregnant. There is a potential for serious side effects to an unborn child. Talk to your health care professional or pharmacist for more information. Do not breast-feed an infant while taking this medicine. This medicine may increase blood sugar. Ask your healthcare provider if changes in diet or medicines are needed if you have diabetes. If you are going to need surgery or other procedure, tell your doctor that you are using this medicine. This drug is only part of a total heart-health program. Your doctor or a dietician can suggest a low-cholesterol and low-fat  diet to help. Avoid alcohol and smoking, and keep a proper exercise schedule. This medicine may cause a decrease in Co-Enzyme Q-10. You should make sure that you get enough Co-Enzyme Q-10 while you are taking this medicine. Discuss the foods you eat and the vitamins you take with your health care professional. What side effects may I notice from receiving this medicine? Side effects that you should report to your doctor or health care professional as soon as possible:  allergic reactions like skin rash, itching or hives, swelling of the face, lips, or tongue  dark urine  fever  joint pain  muscle cramps, pain  redness, blistering, peeling or loosening of the skin, including inside the  mouth  signs and symptoms of high blood sugar such as being more thirsty or hungry or having to urinate more than normal. You may also feel very tired or have blurry vision.  trouble passing urine or change in the amount of urine  unusually weak  yellowing of the eyes or skin Side effects that usually do not require medical attention (report these to your doctor or health care professional if they continue or are bothersome):  constipation  heartburn  nausea  stomach gas, pain, upset This list may not describe all possible side effects. Call your doctor for medical advice about side effects. You may report side effects to FDA at 1-800-FDA-1088. Where should I keep my medicine? Keep out of the reach of children. Store at room temperature between 20 and 25 degrees C (68 and 77 degrees F). Keep container tightly closed (protect from moisture). Throw away any unused medicine after the expiration date. NOTE: This sheet is a summary. It may not cover all possible information. If you have questions about this medicine, talk to your doctor, pharmacist, or health care provider.  2020 Elsevier/Gold Standard (2018-09-17 10:34:28)

## 2019-02-19 NOTE — Progress Notes (Signed)
Office Visit    Patient Name: Martin Berry Date of Encounter: 02/19/2019  Primary Care Provider:  Ninfa Meeker, FNP Primary Cardiologist:  Gypsy Balsam, MD Electrophysiologist:  None   Chief Complaint    Martin Berry is a 38 y.o. male with a hx of HTN, HLD, DOE presents today for follow up after abnormal exercise tolerance test.   Past Medical History    Past Medical History:  Diagnosis Date  . DOE (dyspnea on exertion)   . GERD (gastroesophageal reflux disease)   . Hyperlipidemia   . Hypertension   . OSA on CPAP   . PAC (premature atrial contraction)   . Palpitations   . PVC (premature ventricular contraction)    Past Surgical History:  Procedure Laterality Date  . NO PAST SURGERIES      Allergies  Allergies  Allergen Reactions  . Levaquin [Levofloxacin] Rash    Pt states he gets SOB & rash     History of Present Illness    Martin Berry is a 38 y.o. male with a hx of HTN, HLD, GERD, OSA on CPAP, atypical chest pain, fatigue, DOE last seen 01/30/19 by Dr. Bing Matter. A ZIO 01/20/19 done for palpitations showed SR, infrequent PACs, infrequent PVCs, maximum HR 153. Noted minimum HR 31 bpm at night which was a triggered event. He reported that he pressed the button at 2 am due to waking with numbness/tingling in his hands. An ETT (exercise tolerance test) was done to assess chronotropic response. ETT 02/16/19 showed horizontal ST depressions progressive with exercise in II/III/aVF and horizontal downsloping depression in V6. His peak HR with exercise was 164 and peak BP 162/72.  Follows with pulmonology Dr. Maple Hudson and recent normal PFTs.  Visit completed with his wife on speaker phone. They are both understandable frustrated as they feel he has had symptoms for 1 year, worse over the last 6 months, with no clear answers.   Endorses he has changed his diet to avoid carbohydrates, sugar, rice. Exercises regularly by walking, doing pushups, and some bicep curls. Does not  have chest pain with these activities. He has started doing short bursts of running during his walking regimen without difficulty.   Reports that his shortness of breath is seemingly "random" and he will have moments where he "just can't catch my breath" oftentimes at rest. He denies chest pain.   We discussed his ETT findings at length. Normal BP and HR response to exercise. Concern for area of ischemia. We discussed cardiac CTA vs cardiac catheterization. As he has no new nor worsening anginal symptoms agreed to proceed with cardiac CTA.   We discussed recent lipid panel with his PCP. LDL is 170 on no statin. Tells me he was on Atorvastatin before, had no side effects, but stopped this medication after reviewing the potential side effects. Discussed that cardiac CTA will help [place] set an LDL goal of <70 or <90. Discussed rosuvastatin and he is agreeable to consider due to low side effect profile. Discussed that his HLD is likely genetic as his diet and exercise are both good.    EKGs/Labs/Other Studies Reviewed:   The following studies were reviewed today:  ETT 02/14/19  Horizontal ST segment depression ST segment depression of 2 mm was noted during stress in the II, III, aVF and V6 leads, and returning to baseline after 1-5 minutes of recovery.   Positive adequate stress test. Horizontal ST depressions that were progressive with exercise in II/III/aVf with horizontal to downsloping  depressions in V6.  ZIO Monitor 01/20/19 Baseline rhythm: Sinus   Minimum heart rate: 31 BPM.  Average heart rate: 62 BPM.  Maximal heart rate 153 BPM.   Atrial arrhythmia: Infrequent PACs, 2 runs of narrow complex tachycardia.  Fastest episode 5 beats at rate of 118, longest episode 6 beats   Ventricular arrhythmia: Infrequent PVCs   Conduction abnormality: None   Symptoms: Trigger events at 05/31/2016 morning while patient was in significant sinus bradycardia however no symptoms described Conclusion:   Bradycardia during the night is approximately triggered by patient is a symptomatic.   EKG:  No EKG today.  Recent Labs: 11/11/2018: BUN 11; Creatinine, Ser 0.99; Hemoglobin 16.0; Platelets 193.0; Potassium 4.0; Sodium 138  Recent Lipid Panel No results found for: CHOL, TRIG, HDL, CHOLHDL, VLDL, LDLCALC, LDLDIRECT  Home Medications   Current Meds  Medication Sig  . amLODipine (NORVASC) 5 MG tablet Take 1 tablet (5 mg total) by mouth daily.  Marland Kitchen omeprazole (PRILOSEC) 20 MG capsule   . SYMBICORT 160-4.5 MCG/ACT inhaler   . telmisartan-hydrochlorothiazide (MICARDIS HCT) 40-12.5 MG tablet Take 1 tablet by mouth daily.      Review of Systems      Review of Systems  Constitution: Positive for malaise/fatigue. Negative for chills and fever.  Cardiovascular: Positive for dyspnea on exertion. Negative for chest pain, leg swelling, near-syncope, orthopnea and palpitations.  Respiratory: Positive for shortness of breath. Negative for cough and wheezing.   Gastrointestinal: Negative for nausea and vomiting.  Neurological: Negative for dizziness, light-headedness and weakness.   All other systems reviewed and are otherwise negative except as noted above.  Physical Exam    VS:  BP 138/82 (BP Location: Left Arm, Patient Position: Sitting, Cuff Size: Normal)   Pulse (!) 50   Temp 97.7 F (36.5 C) (Temporal)   Ht 6\' 3"  (1.905 m)   Wt 260 lb 8 oz (118.2 kg)   SpO2 99%   BMI 32.56 kg/m  , BMI Body mass index is 32.56 kg/m. GEN: Well nourished, well developed, in no acute distress. HEENT: normal. Neck: Supple, no JVD, carotid bruits, or masses. Cardiac: RRR, no murmurs, rubs, or gallops. No clubbing, cyanosis, edema.  Radials/DP/PT 2+ and equal bilaterally.  Respiratory:  Respirations regular and unlabored, clear to auscultation bilaterally. GI: Soft, nontender, nondistended, BS + x 4. MS: No deformity or atrophy. Skin: Warm and dry, no rash. Neuro:  Strength and sensation are intact.  Psych: Normal affect.  Assessment & Plan    1. Abnormal exercise tolerance test - ETT 02/14/19 with ST depression progressive with exercise in II/III/aVf and horizontal downsloping depression in V6. He reports no chest pain. Endorses continued DOE. After discussion of cardiac CTA vs cardiac catheterization he is agreeable to proceed with cardiac CTA. He will not require Metoprolol prior to procedure as resting HR is 50 bpm. He will return to the office 3-7 days prior to his cardiac CTA for a BMET.   2. [place]. Reports relatively unchanged since last seen. Also notes being "unable to catch my breath" at rest sometimes. Etiology CAD vs pulmonary. Cardiac CTA, as above to rule out CAD. Continues to follow with pulmonary - recent PFTs reportedly normal.   3. HTN - BP well controlled. Continue present antihypertensive regimen.   4. HLD - Lipid panel 02/12/19 with PCP total cholesterol 248, HDL 52, LDL 170, triglycerides 132. Not on statin. Took Atorvastatin previously without side effects, but stopped after reading side effect profile. Discussed that  etiology likely genetic as he eats well and exercises regularly. He is agreeable to consider the addition of Rosuvastatin and was given information to review. Discussed that if he wished could start slow with dosing of 5mg  three times per week. If no calcification on CT, LDL goal <100. If calcification on CT, LDL goal <70.   5. OSA on CPAP - Compliant with CPAP. Follows with pulmonology.  6. Palpitations - Denies recurrence. ZIO 11/2018 with infrequent PVC/PAC.  Disposition: Follow up in 4 week(s) with Dr. 12/2018 as previously scheduled.    Bing Matter, NP 02/19/2019, 12:24 PM

## 2019-02-24 ENCOUNTER — Telehealth: Payer: Self-pay | Admitting: Cardiology

## 2019-02-24 NOTE — Telephone Encounter (Signed)
Wants to discuss lab results  

## 2019-02-24 NOTE — Telephone Encounter (Signed)
Left message for nurse to call me back.

## 2019-02-27 NOTE — Telephone Encounter (Signed)
Gregary Signs, RN spoke with Martin Berry yesterday. Discussed lab that were drawn in the office. Gregary Signs discussed results with Dr. Agustin Cree.

## 2019-03-13 ENCOUNTER — Ambulatory Visit: Payer: PRIVATE HEALTH INSURANCE | Admitting: Cardiology

## 2019-03-17 ENCOUNTER — Ambulatory Visit (INDEPENDENT_AMBULATORY_CARE_PROVIDER_SITE_OTHER): Payer: Commercial Managed Care - PPO | Admitting: Internal Medicine

## 2019-03-17 ENCOUNTER — Encounter: Payer: Self-pay | Admitting: Internal Medicine

## 2019-03-17 ENCOUNTER — Ambulatory Visit: Payer: PRIVATE HEALTH INSURANCE | Admitting: Internal Medicine

## 2019-03-17 ENCOUNTER — Ambulatory Visit (INDEPENDENT_AMBULATORY_CARE_PROVIDER_SITE_OTHER): Payer: PRIVATE HEALTH INSURANCE

## 2019-03-17 ENCOUNTER — Other Ambulatory Visit: Payer: Self-pay

## 2019-03-17 VITALS — BP 116/74 | HR 55 | Temp 97.6°F | Ht 75.0 in | Wt 260.0 lb

## 2019-03-17 DIAGNOSIS — R0600 Dyspnea, unspecified: Secondary | ICD-10-CM | POA: Diagnosis not present

## 2019-03-17 DIAGNOSIS — R0609 Other forms of dyspnea: Secondary | ICD-10-CM

## 2019-03-17 DIAGNOSIS — G4733 Obstructive sleep apnea (adult) (pediatric): Secondary | ICD-10-CM

## 2019-03-17 DIAGNOSIS — R079 Chest pain, unspecified: Secondary | ICD-10-CM | POA: Diagnosis not present

## 2019-03-17 DIAGNOSIS — R0789 Other chest pain: Secondary | ICD-10-CM

## 2019-03-17 NOTE — Patient Instructions (Signed)
Our goal is for you to use CPAP whenever you sleep. At a minimum, try for at least 4 hours.  Order- CXR  Dx    Atypical left chest pain  Please cal if we can help

## 2019-03-17 NOTE — Progress Notes (Signed)
HPI M never smoker followed for OSA, Dyspnea, complicated by HBP, Vasomotor Rhinitis, Dyspnea on exertion NPSG (Chodri)04/22/18- AHI 33.7/ hr , desaturation to 80%, body weight 273 lbs Epworth score 16 PFT- 01/31/2019- WNL  -------------------------------------------------------------------------------------------------- 615/2020- 38 yoM never smoker for sleep evaluation Medical problem list includes HBP, UTI -----self referred via pt wife; sleep study 04/22/2018; OSA on CPAP, DME: [facility], pt states he does not sleep well with current machine, pressure 7 Wife on speaker phone during this visit. NPSG (Chodri)04/22/18- AHI 33.7/ hr , desaturation to 80%, body weight 273 lbs Epworth score 16 CPAP 7/ [facility] Download compliance 30 %, AHI 5.3/ hr Body weight today 261 lbs Did well initially with CPAP, then began getting recurrent nasal congestion with nasal pillows. Better with change to full face. Wife confirms it stops his snoring.  ENT surgery- none. Denies heart or known lung disease. Additional concern- Dyspnea.  Treated for UTI with levaquin- precipitated rash and acute illness with weakness, myalgias, pain in low back and down backs of legs, shortness of breath. CXR x 2 were reported clear. Test neg  for Covid. Rx'd prednisone, kenalog inj, albuterol inhaler. Then PCP gave Symbicort but even 1 puff once daily raises his BP. No personal or family hx clotting. Gradually feeling better with breathing about baseline.  03/17/2019-  38 yoM never smoker followed for OSA, Dyspnea, complicated by HBP, Vasomotor Rhinitis, Dyspnea on exertion CPAP 4-15/ [facility] Download compliance 40%, AHI 3.7/ hr Body weight today 260 lbs PFT- 01/31/2019- WNL Wife on speaker phone            He declines flu vax Admits he just doesn't put CPAP on some nights, takes it off in sleep other nights Tends to fall asleep in chair. Not uncomfortable. Discussed compliance goals. In last 3-4 days has had persistent somewhat sharp L  pectoral pain which tends to be gone during night, returns as he gets up and starts moving in AM. Not radiating or noted to be positional, exertional related to meals or breathing otherwise. Tried Symbicort without effect on this feeling.  Pending cardiac w/u for bradycardia.  ROS-see HPI   + = positive Constitutional:    weight loss, night sweats, fevers, chills, fatigue, lassitude. HEENT:    headaches, difficulty swallowing, tooth/dental problems, sore throat,       sneezing, itching, ear ache, nasal congestion, post nasal drip, snoring CV:   + chest pain, orthopnea, PND, swelling in lower extremities, anasarca,                                  dizziness, palpitations Resp:  + shortness of breath with exertion or at rest.                productive cough,   non-productive cough, coughing up of blood.              change in color of mucus.  wheezing.   Skin:    rash or lesions. GI:  No-   heartburn, indigestion, abdominal pain, nausea, vomiting, diarrhea,                 change in bowel habits, loss of appetite GU: dysuria, change in color of urine, no urgency or frequency.   flank pain. MS:   joint pain, stiffness, decreased range of motion, back pain. Neuro-     nothing unusual Psych:  change in mood or affect.  depression or  anxiety.   memory loss.  OBJ- Physical Exam General- Alert, Oriented, Affect-appropriate, Distress- none acute,  Muscular/ big man Skin- rash-none, lesions- none, excoriation- none Lymphadenopathy- none Head- atraumatic            Eyes- Gross vision intact, PERRLA, conjunctivae and secretions clear            Ears- Hearing, canals-normal            Nose- Clear, no-Septal dev, mucus, polyps, erosion, perforation             Throat- Mallampati II , mucosa clear , drainage- none, tonsils- atrophic Neck- flexible , trachea midline, no stridor , thyroid nl, carotid no bruit Chest - symmetrical excursion , unlabored           Heart/CV- RRR , no murmur , no gallop  , no  rub, nl s1 s2                           - JVD- none , edema- none, stasis changes- none, varices- none           Lung- clear to P&A, wheeze- none, cough- none , dullness-none, rub- none           Chest wall-  Abd-  Br/ Gen/ Rectal- Not done, not indicated Extrem- cyanosis- none, clubbing, none, atrophy- none, strength- nl Neuro- grossly intact to observation

## 2019-03-18 LAB — BASIC METABOLIC PANEL
BUN/Creatinine Ratio: 8 — ABNORMAL LOW (ref 9–20)
BUN: 9 mg/dL (ref 6–20)
CO2: 25 mmol/L (ref 20–29)
Calcium: 10.4 mg/dL — ABNORMAL HIGH (ref 8.7–10.2)
Chloride: 103 mmol/L (ref 96–106)
Creatinine, Ser: 1.11 mg/dL (ref 0.76–1.27)
GFR calc Af Amer: 96 mL/min/{1.73_m2} (ref >59)
GFR calc non Af Amer: 83 mL/min/{1.73_m2} (ref >59)
Glucose: 97 mg/dL (ref 65–99)
Potassium: 4.1 mmol/L (ref 3.5–5.2)
Sodium: 141 mmol/L (ref 134–144)

## 2019-03-18 NOTE — Assessment & Plan Note (Addendum)
Pending cardiac w/u for bradycardia. Musculoskeletal in pattern Plan- heat, anti-inflammatories, light stretching, CXR

## 2019-03-18 NOTE — Assessment & Plan Note (Signed)
Normal PFT and clear chest on exam. Pending cardiac w/u. Reassurance may be helpful here.  Imp- watch for effect of bronchodilators.  Plan- CXR

## 2019-03-18 NOTE — Assessment & Plan Note (Signed)
CPAP benefits.  Plan continue auto 4-15, emphasize compliance and comfort. Wife motivated to support him with this.

## 2019-03-21 ENCOUNTER — Telehealth (HOSPITAL_COMMUNITY): Payer: Self-pay | Admitting: Emergency Medicine

## 2019-03-21 NOTE — Telephone Encounter (Signed)
Reaching out to patient to offer assistance regarding upcoming cardiac imaging study; pt verbalizes understanding of appt date/time, parking situation and where to check in, pre-test NPO status and medications ordered, and verified current allergies; name and call back number provided for further questions should they arise Janiel Crisostomo RN Navigator Cardiac Imaging [facility] [phone] office [phone] cell 

## 2019-03-24 ENCOUNTER — Encounter (HOSPITAL_COMMUNITY): Payer: Self-pay

## 2019-03-24 ENCOUNTER — Ambulatory Visit (HOSPITAL_COMMUNITY)
Admission: RE | Admit: 2019-03-24 | Discharge: 2019-03-24 | Disposition: A | Discharge status: Home / Self Care | Payer: Commercial Managed Care - PPO | Source: Ambulatory Visit | Attending: Family | Admitting: Family

## 2019-03-24 ENCOUNTER — Other Ambulatory Visit: Payer: Self-pay

## 2019-03-24 DIAGNOSIS — R072 Precordial pain: Secondary | ICD-10-CM

## 2019-03-24 MED ORDER — NITROGLYCERIN 0.4 MG SL SUBL
0.8000 mg | SUBLINGUAL_TABLET | Freq: Once | SUBLINGUAL | Status: AC
  Administered 2019-03-24: 11:00:00 0.8 mg via SUBLINGUAL

## 2019-03-24 MED ORDER — IOHEXOL 350 MG/ML SOLN
100.0000 mL | Freq: Once | INTRAVENOUS | Status: AC | PRN
  Administered 2019-03-24: 100 mL via INTRAVENOUS

## 2019-03-24 MED ORDER — NITROGLYCERIN 0.4 MG SL SUBL
SUBLINGUAL_TABLET | SUBLINGUAL | Status: AC
  Filled 2019-03-24: qty 2

## 2019-03-24 NOTE — Discharge Instructions (Signed)
Testing With IV Contrast Material °IV contrast material is a fluid that is used with some imaging tests. It is injected into your body through a vein. Contrast material is used when your health care providers need a detailed look at organs, tissues, or blood vessels that may not show up with the standard test. The material may be used when an X-ray, an MRI, a CT scan, or an ultrasound is done. °IV contrast material may be used for imaging tests that check: °· Muscles, skin, and fat. °· Breasts. °· Brain. °· Digestive tract. °· Heart. °· Organs such as the liver, kidneys, lungs, bladder, and many others. °· Arteries and veins. °Tell a health care provider about: °· Any allergies you have, especially an allergy to contrast material. °· All medicines you are taking, including metformin, beta blockers, NSAIDs (such as ibuprofen), interleukin-2, vitamins, herbs, eye drops, creams, and over-the-counter medicines. °· Any problems you or family members have had with the use of contrast material. °· Any blood disorders you have, such as sickle cell anemia. °· Any surgeries you have had. °· Any medical conditions you have or have had, especially alcohol abuse, dehydration, asthma, or kidney, liver, or heart problems. °· Whether you are pregnant or may be pregnant. °· Whether you are breastfeeding. Most contrast materials are safe for use in breastfeeding women. °What are the risks? °Generally, this is a safe procedure. However, problems may occur, including: °· Headache. °· Itching, skin rash, and hives. °· Nausea and vomiting. °· Allergic reactions. °· Wheezing or difficulty breathing. °· Abnormal heart rate. °· Changes in blood pressure. °· Throat swelling. °· Kidney damage. °What happens before the procedure? °Medicines °Ask your health care provider about: °· Changing or stopping your regular medicines. This is especially important if you are taking diabetes medicines or blood thinners. °· Taking medicines such as aspirin  and ibuprofen. These medicines can thin your blood. Do not take these medicines unless your health care provider tells you to take them. °· Taking over-the-counter medicines, vitamins, herbs, and supplements. °If you are at risk of having a reaction to the IV contrast material, you may be asked to take medicine before the procedure to prevent a reaction. °General instructions °· Follow instructions from your health care provider about eating or drinking restrictions. °· You may have an exam or lab tests to make sure that you can safely get IV contrast material. °· Ask if you will be given a medicine to help you relax (sedative) during the procedure. If so, plan to have someone take you home from the hospital or clinic. °What happens during the procedure? °· You may be given a sedative to help you relax. °· An IV will be inserted into one of your veins. °· Contrast material will be injected into your IV. °· You may feel warmth or flushing as the contrast material enters your bloodstream. °· You may have a metallic taste in your mouth for a few minutes. °· The needle may cause some discomfort and bruising. °· After the contrast material is in your body, the imaging test will be done. °The procedure may vary among health care providers and hospitals. °What can I expect after the procedure? °· The IV will be removed. °· You may be taken to a recovery area if sedation medicines were used. Your blood pressure, heart rate, breathing rate, and blood oxygen level will be monitored until you leave the hospital or clinic. °Follow these instructions at home: ° °· Take over-the-counter and   prescription medicines only as told by your health care provider. °? Your health care provider may tell you to not take certain medicines for a couple of days after the procedure. This is especially important if you are taking diabetes medicines. °· If you are told, drink enough fluid to keep your urine pale yellow. This will help to remove  the contrast material out of your body. °· Do not drive for 24 hours if you were given a sedative during your procedure. °· It is up to you to get the results of your procedure. Ask your health care provider, or the department that is doing the procedure, when your results will be ready. °· Keep all follow-up visits as told by your health care provider. This is important. °Contact a health care provider if: °· You have redness, swelling, or pain near your IV site. °Get help right away if: °· You have an abnormal heart rhythm. °· You have trouble breathing. °· You have: °? Chest pain. °? Pain in your back, neck, arm, jaw, or stomach. °? Nausea or sweating. °? Hives or a rash. °· You start shaking and cannot stop. °These symptoms may represent a serious problem that is an emergency. Do not wait to see if the symptoms will go away. Get medical help right away. Call your local emergency services (911 in the [place] Do not drive yourself to the hospital. °Summary °· IV contrast material may be used for imaging tests to help your health care providers see your organs and tissues more clearly. °· Tell your health care provider if you are pregnant or may be pregnant. °· During the procedure, you may feel warmth or flushing as the contrast material enters your bloodstream. °· After the procedure, drink enough fluid to keep your urine pale yellow. °This information is not intended to replace advice given to you by your health care provider. Make sure you discuss any questions you have with your health care provider. °Document Released: 05/03/2009 Document Revised: 08/01/2018 Document Reviewed: 08/01/2018 °Elsevier Patient Education © 2020 [employer] ° ° °Cardiac CT Angiogram ° °A cardiac CT angiogram is a procedure to look at the heart and the area around the heart. It may be done to help find the cause of chest pains or other symptoms of heart disease. During this procedure, a large X-ray machine, called a CT scanner,  takes detailed pictures of the heart and the surrounding area after a dye (contrast material) has been injected into blood vessels in the area. The procedure is also sometimes called a coronary CT angiogram, coronary artery scanning, or CTA. °A cardiac CT angiogram allows the health care provider to see how well blood is flowing to and from the heart. The health care provider will be able to see if there are any problems, such as: °· Blockage or narrowing of the coronary arteries in the heart. °· Fluid around the heart. °· Signs of weakness or disease in the muscles, valves, and tissues of the heart. °Tell a health care provider about: °· Any allergies you have. This is especially important if you have had a previous allergic reaction to contrast dye. °· All medicines you are taking, including vitamins, herbs, eye drops, creams, and over-the-counter medicines. °· Any blood disorders you have. °· Any surgeries you have had. °· Any medical conditions you have. °· Whether you are pregnant or may be pregnant. °· Any anxiety disorders, chronic pain, or other conditions you have that may increase your stress or prevent   you from lying still. °What are the risks? °Generally, this is a safe procedure. However, problems may occur, including: °· Bleeding. °· Infection. °· Allergic reactions to medicines or dyes. °· Damage to other structures or organs. °· Kidney damage from the dye or contrast that is used. °· Increased risk of cancer from radiation exposure. This risk is low. Talk with your health care provider about: °? The risks and benefits of testing. °? How you can receive the lowest dose of radiation. °What happens before the procedure? °· Wear comfortable clothing and remove any jewelry, glasses, dentures, and hearing aids. °· Follow instructions from your health care provider about eating and drinking. This may include: °? For 12 hours before the test -- avoid caffeine. This includes tea, coffee, soda, energy drinks,  and diet pills. Drink plenty of water or other fluids that do not have caffeine in them. Being well-hydrated can prevent complications. °? For 4-6 hours before the test -- stop eating and drinking. The contrast dye can cause nausea, but this is less likely if your stomach is empty. °· Ask your health care provider about changing or stopping your regular medicines. This is especially important if you are taking diabetes medicines, blood thinners, or medicines to treat erectile dysfunction. °What happens during the procedure? °· Hair on your chest may need to be removed so that small sticky patches called electrodes can be placed on your chest. These will transmit information that helps to monitor your heart during the test. °· An IV tube will be inserted into one of your veins. °· You might be given a medicine to control your heart rate during the test. This will help to ensure that good images are obtained. °· You will be asked to lie on an exam table. This table will slide in and out of the CT machine during the procedure. °· Contrast dye will be injected into the IV tube. You might feel warm, or you may get a metallic taste in your mouth. °· You will be given a medicine (nitroglycerin) to relax (dilate) the arteries in your heart. °· The table that you are lying on will move into the CT machine tunnel for the scan. °· The person running the machine will give you instructions while the scans are being done. You may be asked to: °? Keep your arms above your head. °? Hold your breath. °? Stay very still, even if the table is moving. °· When the scanning is complete, you will be moved out of the machine. °· The IV tube will be removed. °The procedure may vary among health care providers and hospitals. °What happens after the procedure? °· You might feel warm, or you may get a metallic taste in your mouth from the contrast dye. °· You may have a headache from the nitroglycerin. °· After the procedure, drink water or  other fluids to wash (flush) the contrast material out of your body. °· Contact a health care provider if you have any symptoms of allergy to the contrast. These symptoms include: °? Shortness of breath. °? Rash or hives. °? A racing heartbeat. °· Most people can return to their normal activities right after the procedure. Ask your health care provider what activities are safe for you. °· It is up to you to get the results of your procedure. Ask your health care provider, or the department that is doing the procedure, when your results will be ready. °Summary °· A cardiac CT angiogram is a procedure to   look at the heart and the area around the heart. It may be done to help find the cause of chest pains or other symptoms of heart disease. °· During this procedure, a large X-ray machine, called a CT scanner, takes detailed pictures of the heart and the surrounding area after a dye (contrast material) has been injected into blood vessels in the area. °· Ask your health care provider about changing or stopping your regular medicines before the procedure. This is especially important if you are taking diabetes medicines, blood thinners, or medicines to treat erectile dysfunction. °· After the procedure, drink water or other fluids to wash (flush) the contrast material out of your body. °This information is not intended to replace advice given to you by your health care provider. Make sure you discuss any questions you have with your health care provider. °Document Released: 04/27/2008 Document Revised: 04/27/2017 Document Reviewed: 04/03/2016 °Elsevier Patient Education © 2020 [employer] ° °

## 2019-03-24 NOTE — Progress Notes (Signed)
Pt tolerated exam without incident.  PIV removed and applied.  Pt provided with snack and beverage.  Discharge instructions discussed and all questions answered.  Pt discharged

## 2019-03-27 ENCOUNTER — Telehealth (INDEPENDENT_AMBULATORY_CARE_PROVIDER_SITE_OTHER): Payer: PRIVATE HEALTH INSURANCE | Admitting: Cardiology

## 2019-03-27 ENCOUNTER — Other Ambulatory Visit: Payer: Self-pay

## 2019-03-27 ENCOUNTER — Encounter: Payer: Self-pay | Admitting: Cardiology

## 2019-03-27 VITALS — BP 130/81 | Wt 258.0 lb

## 2019-03-27 DIAGNOSIS — E782 Mixed hyperlipidemia: Secondary | ICD-10-CM

## 2019-03-27 DIAGNOSIS — I10 Essential (primary) hypertension: Secondary | ICD-10-CM

## 2019-03-27 DIAGNOSIS — I493 Ventricular premature depolarization: Secondary | ICD-10-CM

## 2019-03-27 NOTE — Progress Notes (Signed)
Virtual Visit via Video Note   This visit type was conducted due to national recommendations for restrictions regarding the COVID-19 Pandemic (e.g. social distancing) in an effort to limit this patient's exposure and mitigate transmission in our community.  Due to his co-morbid illnesses, this patient is at least at moderate risk for complications without adequate follow up.  This format is felt to be most appropriate for this patient at this time.  All issues noted in this document were discussed and addressed.  A limited physical exam was performed with this format.  Please refer to the patient's chart for his consent to telehealth for [facility].  Evaluation Performed:  Follow-up visit  This visit type was conducted due to national recommendations for restrictions regarding the COVID-19 Pandemic (e.g. social distancing).  This format is felt to be most appropriate for this patient at this time.  All issues noted in this document were discussed and addressed.  No physical exam was performed (except for noted visual exam findings with Video Visits).  Please refer to the patient's chart (MyChart message for video visits and phone note for telephone visits) for the patient's consent to telehealth for [facility].  Date:  03/27/2019  ID: Roanna Epley, DOB [birth year], MRN [id number]   Patient Location: [place] [address]   Provider location:   [place] Office  PCP:  Leilani Able, FNP  Cardiologist:  Jenne Campus, MD     Chief Complaint: Doing well  History of Present Illness:    Martin Berry is a 38 y.o. male  who presents via audio/video conferencing for a telehealth visit today.  With palpitations who was referred to [place] for atypical chest pain as well*is quite complicated because we put monitor on we find some APCs or PVCs nodes were not concerning however interestingly he had slow heart rate.  Then he had a plain treadmill EKG stress test to  see what the chronotropic response is like and surprisingly he got abnormal ST segment.  After that we decided to do calcium score which came 0.  I think we can drop the issue of active coronary artery disease however risk factors modification need to be done I talked in length about healthy lifestyle he already does exercise on the regular basis I encouraged him to do that.  We also talked about good diet which he already does.  I also talk about cholesterol medication he is open for this medication however he said he preferred to exercise first and eat better for few months before committing to medications.  Therefore I see him back in 6 months we will check his fasting lipid profile in about 3 months   The patient does not have symptoms concerning for COVID-19 infection (fever, chills, cough, or new SHORTNESS OF BREATH).    Prior CV studies:   The following studies were reviewed today:       Past Medical History:  Diagnosis Date  . DOE (dyspnea on exertion)   . GERD (gastroesophageal reflux disease)   . Hyperlipidemia   . Hypertension   . OSA on CPAP   . PAC (premature atrial contraction)   . Palpitations   . PVC (premature ventricular contraction)     Past Surgical History:  Procedure Laterality Date  . NO PAST SURGERIES       Current Meds  Medication Sig  . albuterol (VENTOLIN HFA) 108 (90 Base) MCG/ACT inhaler Inhale into the lungs every 6 (six)  hours as needed for wheezing or shortness of breath.  Marland Kitchen amLODipine (NORVASC) 5 MG tablet Take 1 tablet (5 mg total) by mouth daily.  . hydrOXYzine (ATARAX/VISTARIL) 50 MG tablet Take 50 mg by mouth every 6 (six) hours as needed.  Marland Kitchen omeprazole (PRILOSEC) 20 MG capsule   . SYMBICORT 160-4.5 MCG/ACT inhaler Inhale 2 puffs into the lungs daily.   Marland Kitchen telmisartan-hydrochlorothiazide (MICARDIS HCT) 40-12.5 MG tablet Take 1 tablet by mouth daily.      Family History: The patient's family history includes Healthy in his mother;  Hyperlipidemia in his father.   ROS:   Please see the history of present illness.     All other systems reviewed and are negative.   Labs/Other Tests and Data Reviewed:     Recent Labs: 11/11/2018: Hemoglobin 16.0; Platelets 193.0 03/17/2019: BUN 9; Creatinine, Ser 1.11; Potassium 4.1; Sodium 141  Recent Lipid Panel No results found for: CHOL, TRIG, HDL, CHOLHDL, VLDL, LDLCALC, LDLDIRECT    Exam:    Vital Signs:  BP 130/81   Wt 258 lb (117 kg)   BMI 32.25 kg/m     Wt Readings from Last 3 Encounters:  03/27/19 258 lb (117 kg)  03/17/19 260 lb (117.9 kg)  02/19/19 260 lb 8 oz (118.2 kg)     Well nourished, well developed in no acute distress. Alert awake and attentive 3 talking to me over the video link not in any distress  Diagnosis for this visit:   1. Essential hypertension   2. PVC (premature ventricular contraction)   3. Mixed hyperlipidemia      ASSESSMENT & PLAN:    1.  Essential hypertension blood pressure well controlled 2.  PVCs denies having any 3.  Dyslipidemia prefers conservative approach first with diet and exercise and then if it fail we will continue with medications 4.  Falsely abnormal stress test noted calcium score is 0 after that  COVID-19 Education: The signs and symptoms of COVID-19 were discussed with the patient and how to seek care for testing (follow up with PCP or arrange E-visit).  The importance of social distancing was discussed today.  Patient Risk:   After full review of this patients clinical status, I feel that they are at least moderate risk at this time.  Time:   Today, I have spent 5 minutes with the patient with telehealth technology discussing pt health issues.  I spent 20 minutes reviewing her chart before the visit.  Visit was finished at 1:58 PM.    Medication Adjustments/Labs and Tests Ordered: Current medicines are reviewed at length with the patient today.  Concerns regarding medicines are outlined above.  No  orders of the defined types were placed in this encounter.  Medication changes: No orders of the defined types were placed in this encounter.    Disposition: Follow-up in 6 months  Signed, Georgeanna Lea, MD, [facility] 03/27/2019 1:56 PM    [facility]

## 2019-03-27 NOTE — Patient Instructions (Signed)
Medication Instructions:  Your physician recommends that you continue on your current medications as directed. Please refer to the Current Medication list given to you today.  *If you need a refill on your cardiac medications before your next appointment, please call your pharmacy*  Lab Work: Your physician recommends that you return for lab work in 3 months fasting: Lipids   If you have labs (blood work) drawn today and your tests are completely normal, you will receive your results only by: Marland Kitchen MyChart Message (if you have MyChart) OR . A paper copy in the mail If you have any lab test that is abnormal or we need to change your treatment, we will call you to review the results.  Testing/Procedures: None.   Follow-Up: At [facility], you and your health needs are our priority.  As part of our continuing mission to provide you with exceptional heart care, we have created designated Provider Care Teams.  These Care Teams include your primary Cardiologist (physician) and Advanced Practice Providers (APPs -  Physician Assistants and Nurse Practitioners) who all work together to provide you with the care you need, when you need it.  Your next appointment:   6 months  The format for your next appointment:   In Person  Provider:   You will see Jenne Campus, MD.  Or, you can be scheduled with the following Advanced Practice Provider on your designated Care Team (at our [facility]):  Laurann Montana, FNP    Other Instructions

## 2019-04-23 ENCOUNTER — Other Ambulatory Visit: Payer: Self-pay

## 2019-04-23 ENCOUNTER — Encounter: Payer: Self-pay | Admitting: Allergy and Immunology

## 2019-04-23 ENCOUNTER — Ambulatory Visit (INDEPENDENT_AMBULATORY_CARE_PROVIDER_SITE_OTHER): Payer: Commercial Managed Care - PPO | Admitting: Allergy and Immunology

## 2019-04-23 ENCOUNTER — Telehealth: Payer: Self-pay

## 2019-04-23 VITALS — BP 146/90 | HR 64 | Temp 97.7°F | Resp 16 | Ht 73.6 in | Wt 260.8 lb

## 2019-04-23 DIAGNOSIS — J4530 Mild persistent asthma, uncomplicated: Secondary | ICD-10-CM | POA: Diagnosis not present

## 2019-04-23 DIAGNOSIS — J3089 Other allergic rhinitis: Secondary | ICD-10-CM

## 2019-04-23 DIAGNOSIS — F419 Anxiety disorder, unspecified: Secondary | ICD-10-CM

## 2019-04-23 DIAGNOSIS — G43909 Migraine, unspecified, not intractable, without status migrainosus: Secondary | ICD-10-CM

## 2019-04-23 DIAGNOSIS — K219 Gastro-esophageal reflux disease without esophagitis: Secondary | ICD-10-CM

## 2019-04-23 DIAGNOSIS — G629 Polyneuropathy, unspecified: Secondary | ICD-10-CM

## 2019-04-23 DIAGNOSIS — K146 Glossodynia: Secondary | ICD-10-CM

## 2019-04-23 DIAGNOSIS — G4730 Sleep apnea, unspecified: Secondary | ICD-10-CM

## 2019-04-23 DIAGNOSIS — L500 Allergic urticaria: Secondary | ICD-10-CM

## 2019-04-23 MED ORDER — FAMOTIDINE 40 MG PO TABS: 40.0000 mg | ORAL_TABLET | Freq: Every day | ORAL | 5 refills | Status: AC

## 2019-04-23 MED ORDER — OMEPRAZOLE 40 MG PO CPDR: 40.0000 mg | DELAYED_RELEASE_CAPSULE | ORAL | 5 refills | Status: AC

## 2019-04-23 MED ORDER — MOMETASONE FUROATE 50 MCG/ACT NA SUSP: NASAL | 5 refills | Status: DC

## 2019-04-23 NOTE — Telephone Encounter (Signed)
PA for Mometasone was sent through [email or web address]

## 2019-04-23 NOTE — Patient Instructions (Addendum)
  1.  Blood -alpha gal panel, area two aero allergen profile, CBC with differential, TSH, free T4, TP,  2.  Treat and prevent inflammation:   A.  Pulmicort 180-2 inhalations 1 time per day (replaces Symbicort)  B.  Generic mometasone-2 sprays each nostril 1 time per day  3.  Treat and prevent reflux:   A.  Slowly consolidate all forms of caffeine  B.  Increase omeprazole 40 mg tablet in a.m.  C.  Start famotidine 40 mg tablet in p.m.  4.  If needed:   A.  Albuterol HFA-2 inhalations every 4-6 hours  B.  Can continue hydroxyzine 25-50 mg every 6 hours  5.  Return to clinic in 4 weeks or earlier if problem

## 2019-04-23 NOTE — Progress Notes (Signed)
[place] [facility] [place]   Dear Martin Berry,  Thank you for referring Martin Berry to the [facility] on 04/23/2019.   Below is a summation of this patient's evaluation and recommendations.  Thank you for your referral. I will keep you informed about this patient's response to treatment.   If you have any questions please do not hesitate to contact me.   Sincerely,  Jiles Prows, MD Allergy / Immunology [place]   ______________________________________________________________________    NEW PATIENT NOTE  Referring Provider: Farrel Conners* Primary Provider: Leilani Able, FNP Date of office visit: 04/23/2019    Subjective:   Chief Complaint:  Martin Berry (DOB: [birth year]) is a 38 y.o. male who presents to the clinic on 04/23/2019 with a chief complaint of Breathing Problem and Urticaria .     HPI: Martin Berry presents to this clinic in evaluation of multiple issues.  First, he has undergone evaluation for "trouble breathing".  He gets intermittent shortness of breath although he can walk without any difficulty and in fact he feels better when he exercises.  He has undergone cardiac evaluation with a negative work-up including a CT scan of his chest and an echocardiogram.  This appears to be an intermittent issue and he can go a month with some difficulty and then he can go a month with no difficulty.  Second, he states that he gets "sinus headaches".  Apparently the he has a long history of having "sinus" where he will develop a headache in the front of his head that is pounding associated with dizziness and nausea and a rare vomiting and vision changes that appear to be occurring a few times per week.  Apparently he has been given antibiotics for this issue in the past.  He may have some problems with intermittent nasal congestion and  sneezing but he does not have a history of ugly nasal discharge or anosmia and there is no obvious trigger for the symptoms.  It should be noted that he drinks 1 to 2 gallons of tea per day and has 1 diet drink per day.  Third, he has sleep apnea for which she has been given a CPAP machine of which he is having some difficulty utilizing at this point in time.  Fourth, he apparently has developed rather significant hypertension and has been on multiple medications.  He has been on amlodipine for a long period in time and metoprolol and Micardis introduced at the beginning of this year and that is when most of his problems developed with shortness of breath.  He had his drugs manipulated and he is currently just on amlodipine and Micardis and the metoprolol has been eliminated.  Fifth, he has a problem inside his mouth.  His mouth feels "puffy".  And his mouth just feels "funny".  If he eats spicy food his cheeks and lips will burn.  These episodes can last up to 1 week and they have been present for the past 3 to 4 weeks usually occur about 1 time per month.  Sixth, he always has throat clearing and postnasal drip although he has no classic reflux symptoms to suggest LPR.  As noted above he does drink extensive amounts of caffeine throughout the day.  Seventh, he has an issue with intermittent hives.  He has had 2 rather significant episodes of urticaria manifested as red raised  itchy lesions across his body in August and September of this year for which he took Benadryl and Zyrtec.  Eighth, he has an issue with the bottoms of his feet feeling as though they are burning.  This started in June 2020 and has been an intermittent issue.  He also develops these pains in his posterior thighs that also appears to wax and wane somewhat.  He does not believe that he has lost any strength in his muscles and he can still exercise without problem.  Ninth, he has an issue with anxiety for which he uses hydroxyzine  at night.  Past Medical History:  Diagnosis Date  . DOE (dyspnea on exertion)   . GERD (gastroesophageal reflux disease)   . Hyperlipidemia   . Hypertension   . OSA on CPAP   . PAC (premature atrial contraction)   . Palpitations   . PVC (premature ventricular contraction)     Past Surgical History:  Procedure Laterality Date  . NO PAST SURGERIES      Allergies as of 04/23/2019      Reactions   Levaquin [levofloxacin] Rash   Pt states he gets SOB & rash       Medication List      albuterol 108 (90 Base) MCG/ACT inhaler Commonly known as: VENTOLIN HFA Inhale into the lungs every 6 (six) hours as needed for wheezing or shortness of breath.   amLODipine 5 MG tablet Commonly known as: NORVASC Take 1 tablet (5 mg total) by mouth daily.     hydrOXYzine 50 MG tablet Commonly known as: ATARAX/VISTARIL Take 50 mg by mouth every 6 (six) hours as needed.       Symbicort 160-4.5 MCG/ACT inhaler Generic drug: budesonide-formoterol Inhale 2 puffs into the lungs daily.   telmisartan-hydrochlorothiazide 40-12.5 MG tablet Commonly known as: Micardis HCT Take 1 tablet by mouth daily.       Review of systems negative except as noted in HPI / PMHx or noted below:  Review of Systems  Constitutional: Negative.   HENT: Negative.   Eyes: Negative.   Respiratory: Negative.   Cardiovascular: Negative.   Gastrointestinal: Negative.   Genitourinary: Negative.   Musculoskeletal: Negative.   Skin: Negative.   Neurological: Negative.   Endo/Heme/Allergies: Negative.   Psychiatric/Behavioral: Negative.     Family History  Problem Relation Age of Onset  . Healthy Mother   . Hyperlipidemia Father   . Heart attack Father   . Diabetes Maternal Uncle   . Diabetes Maternal Grandfather     Social History   Socioeconomic History  . Marital status: Married    Spouse name: Not on file  . Number of children: Not on file  . Years of education: Not on file  . Highest  education level: Not on file  Occupational History  . Not on file  Social Needs  . Financial resource strain: Not on file  . Food insecurity    Worry: Not on file    Inability: Not on file  . Transportation needs    Medical: Not on file    Non-medical: Not on file  Tobacco Use  . Smoking status: Never Smoker  . Smokeless tobacco: Former NeurosurgeonUser    Types: Chew  Substance and Sexual Activity  . Alcohol use: Yes    Frequency: Never  . Drug use: Never  . Sexual activity: Not on file  Lifestyle  . Physical activity    Days per week: Not on file    Minutes per  session: Not on file  . Stress: Not on file  Relationships  . Social Musician on phone: Not on file    Gets together: Not on file    Attends religious service: Not on file    Active member of club or organization: Not on file    Attends meetings of clubs or organizations: Not on file    Relationship status: Not on file  . Intimate partner violence    Fear of current or ex partner: Not on file    Emotionally abused: Not on file    Physically abused: Not on file    Forced sexual activity: Not on file  Other Topics Concern  . Not on file  Social History Narrative  . Not on file    Environmental and Social history  Lives in a house with a dry environment, dogs located inside the household, no carpet in the bedroom, no plastic on the fact, no plastic on the pillow, no smokers located inside the household.  He works as an Nutritional therapist.  Objective:   Vitals:   04/23/19 1415  BP: (!) 146/90  Pulse: 64  Resp: 16  Temp: 97.7 F (36.5 C)  SpO2: 97%   Height: 6' 1.6" (186.9 cm) Weight: 260 lb 12.8 oz (118.3 kg)  Physical Exam Constitutional:      Appearance: He is not diaphoretic.  HENT:     Head: Normocephalic. No right periorbital erythema or left periorbital erythema.     Right Ear: Tympanic membrane, ear canal and external ear normal.     Left Ear: Tympanic membrane, ear canal and external  ear normal.     Nose: Nose normal. No mucosal edema or rhinorrhea.     Mouth/Throat:     Pharynx: No oropharyngeal exudate.  Eyes:     General: Lids are normal.     Conjunctiva/sclera: Conjunctivae normal.     Pupils: Pupils are equal, round, and reactive to light.  Neck:     Thyroid: No thyromegaly.     Trachea: Trachea normal. No tracheal deviation.  Cardiovascular:     Rate and Rhythm: Normal rate and regular rhythm.     Heart sounds: Normal heart sounds, S1 normal and S2 normal. No murmur.  Pulmonary:     Effort: Pulmonary effort is normal. No respiratory distress.     Breath sounds: No stridor. No wheezing or rales.  Chest:     Chest wall: No tenderness.  Abdominal:     General: There is no distension.     Palpations: Abdomen is soft. There is no mass.     Tenderness: There is no abdominal tenderness. There is no guarding or rebound.  Musculoskeletal:        General: No tenderness.  Lymphadenopathy:     Head:     Right side of head: No tonsillar adenopathy.     Left side of head: No tonsillar adenopathy.     Cervical: No cervical adenopathy.  Skin:    Coloration: Skin is not pale.     Findings: No erythema or rash.     Nails: There is no clubbing.   Neurological:     Mental Status: He is alert.     Diagnostics: Allergy skin tests were not  performed secondary to the recent use of an antihistamine.   Spirometry was performed and demonstrated an FEV1 of 4.38 @ 92 % of predicted. FEV1/FVC = 0.81  Results of a chest CT angio obtained 24 March 2019 identifies the following:  Aorta:  Normal size.  No calcifications.  No dissection.  Aortic Valve:  Trileaflet.  No calcifications.  Coronary Arteries:  Normal coronary origin.  Right dominance.  RCA is a large dominant artery that gives rise to PDA and PLA. There is no plaque.  Left main is a short artery that gives rise to LAD and LCX arteries. Left main has no plaque.  LAD is a large vessel that gives rise  to one diagonal artery and has no plaque.  LCX is a non-dominant artery that gives rise to one medium caliber OM1 branch. There is no plaque.  Other findings:  Normal pulmonary vein drainage into the left atrium.  Normal left atrial appendage without a thrombus.  Normal size of the pulmonary artery.         Vascular: Aorta is normal caliber.  Heart is normal size.  Mediastinum/Nodes: No adenopathy in the lower mediastinum or hila.  Lungs/Pleura: No confluent opacities or effusions.  Upper Abdomen: Imaging into the upper abdomen shows no acute findings.  Musculoskeletal: Bilateral gynecomastia.  No acute bony abnormality.      Results of a echocardiogram obtained 29 April 2018 identifies the following:  - Left ventricle: The cavity size was normal. Wall thickness was   increased in a pattern of mild LVH. Systolic function was normal.   The estimated ejection fraction was in the range of 55% to 60%.   Wall motion was normal; there were no regional wall motion   abnormalities. Left ventricular diastolic function parameters   were normal. - Ascending aorta: The ascending aorta was mildly dilated. - Left atrium: The atrium was mildly to moderately dilated.   Assessment and Plan:    1. Asthma, well controlled, mild persistent   2. Perennial allergic rhinitis   3. LPRD (laryngopharyngeal reflux disease)   4. Migraine syndrome   5. Allergic urticaria   6. Sleep apnea with use of continuous positive airway pressure (CPAP)   7. Anxiety   8. Neuropathy   9. BMS (burning mouth syndrome)     1.  Blood -alpha gal panel, area two aero allergen profile, CBC with differential, TSH, free T4, TP  2.  Treat and prevent inflammation:   A.  Pulmicort 180-2 inhalations 1 time per day (replaces Symbicort)  B.  Generic mometasone-2 sprays each nostril 1 time per day  3.  Treat and prevent reflux:   A.  Slowly consolidate all forms of caffeine  B.  Increase  omeprazole 40 mg tablet in a.m.  C.  Start famotidine 40 mg tablet in p.m.  4.  If needed:   A.  Albuterol HFA-2 inhalations every 4-6 hours  B.  Can continue hydroxyzine 25-50 mg every 6 hours  5.  Return to clinic in 4 weeks or earlier if problem  There are many issues revolving around Martin Berry's symptomatology.  He has some form of immunological hyperreactivity which we will investigate with the blood test noted above which is probably giving rise to some degree of inflammation of his airway.  He has a history very consistent with LPR for which we can hopefully get him to consolidate his rather dramatic caffeine consumption which should also help his anxiety.  There is an issue of possible mouth burning phenomena and a possible neuropathy that we will reassess when he returns to this clinic in 4 weeks for evaluation of his response to the plan noted above.  Jessica Priest, MD Allergy / Immunology  [facility]

## 2019-04-28 ENCOUNTER — Telehealth: Payer: Self-pay

## 2019-04-28 ENCOUNTER — Encounter: Payer: Self-pay | Admitting: Allergy and Immunology

## 2019-04-28 NOTE — Telephone Encounter (Signed)
Called [facility] and had them add additional test.  They will fax [place] to let us know if there is enough of the current specimen left to perform the test.

## 2019-04-28 NOTE — Telephone Encounter (Signed)
-----   Message from Jiles Prows, MD sent at 04/28/2019  8:04 AM EST ----- Please have [facility] add a thyroid peroxidase antibody to labs.

## 2019-04-28 NOTE — Telephone Encounter (Signed)
PA still pending approval.

## 2019-04-29 MED ORDER — QNASL 80 MCG/ACT NA AERS: 1.0000 | INHALATION_SPRAY | Freq: Two times a day (BID) | NASAL | 5 refills | Status: DC

## 2019-04-29 NOTE — Telephone Encounter (Signed)
PA for mometasone was denied Please advise?

## 2019-04-29 NOTE — Telephone Encounter (Signed)
Qnasl 80 - 1 puff each nostril 2 times per day

## 2019-04-29 NOTE — Telephone Encounter (Signed)
Insurance prefer [facility] or Qnasil

## 2019-04-29 NOTE — Telephone Encounter (Signed)
What does his insurance cover and pay for?

## 2019-04-29 NOTE — Telephone Encounter (Signed)
Qnasl 80 sent to pharmacy in replace of mometasone

## 2019-04-30 LAB — SPECIMEN STATUS REPORT

## 2019-04-30 LAB — THYROID PEROXIDASE ANTIBODY: Thyroperoxidase Ab SerPl-aCnc: <9 IU/mL (ref 0–34)

## 2019-05-01 LAB — CBC WITH DIFFERENTIAL/PLATELET
Basophils Absolute: 0 10*3/uL (ref 0.0–0.2)
Basos: 1 %
EOS (ABSOLUTE): 0.4 10*3/uL (ref 0.0–0.4)
Eos: 6 %
Hematocrit: 47.2 % (ref 37.5–51.0)
Hemoglobin: 16.2 g/dL (ref 13.0–17.7)
Immature Grans (Abs): 0 10*3/uL (ref 0.0–0.1)
Immature Granulocytes: 0 %
Lymphocytes Absolute: 2.1 10*3/uL (ref 0.7–3.1)
Lymphs: 31 %
MCH: 29.5 pg (ref 26.6–33.0)
MCHC: 34.3 g/dL (ref 31.5–35.7)
MCV: 86 fL (ref 79–97)
Monocytes Absolute: 0.6 10*3/uL (ref 0.1–0.9)
Monocytes: 9 %
Neutrophils Absolute: 3.7 10*3/uL (ref 1.4–7.0)
Neutrophils: 53 %
Platelets: 202 10*3/uL (ref 150–450)
RBC: 5.49 x10E6/uL (ref 4.14–5.80)
RDW: 12.6 % (ref 11.6–15.4)
WBC: 6.8 10*3/uL (ref 3.4–10.8)

## 2019-05-01 LAB — ALPHA-GAL PANEL
Alpha Gal IgE*: 50 kU/L — ABNORMAL HIGH (ref <0.10)
Beef (Bos spp) IgE: 5.66 kU/L — ABNORMAL HIGH (ref <0.35)
Class Interpretation: 1
Class Interpretation: 2
Class Interpretation: 3
Lamb/Mutton (Ovis spp) IgE: 0.44 kU/L — ABNORMAL HIGH (ref <0.35)
Pork (Sus spp) IgE: 2.42 kU/L — ABNORMAL HIGH (ref <0.35)

## 2019-05-01 LAB — ALLERGENS W/TOTAL IGE AREA 2
Alternaria Alternata IgE: <0.1 kU/L
Aspergillus Fumigatus IgE: <0.1 kU/L
Bermuda Grass IgE: <0.1 kU/L
Cat Dander IgE: 0.17 kU/L — AB
Cedar, Mountain IgE: <0.1 kU/L
Cladosporium Herbarum IgE: <0.1 kU/L
Cockroach, German IgE: <0.1 kU/L
Common Silver Birch IgE: <0.1 kU/L
Cottonwood IgE: <0.1 kU/L
D Farinae IgE: <0.1 kU/L
D Pteronyssinus IgE: <0.1 kU/L
Dog Dander IgE: 0.27 kU/L — AB
Elm, American IgE: <0.1 kU/L
IgE (Immunoglobulin E), Serum: 191 IU/mL (ref 6–495)
Johnson Grass IgE: <0.1 kU/L
Maple/Box Elder IgE: <0.1 kU/L
Mouse Urine IgE: <0.1 kU/L
Oak, White IgE: <0.1 kU/L
Pecan, Hickory IgE: <0.1 kU/L
Penicillium Chrysogen IgE: <0.1 kU/L
Pigweed, Rough IgE: <0.1 kU/L
Ragweed, Short IgE: <0.1 kU/L
Sheep Sorrel IgE Qn: <0.1 kU/L
Timothy Grass IgE: <0.1 kU/L
White Mulberry IgE: <0.1 kU/L

## 2019-05-01 LAB — TSH+FREE T4
Free T4: 1.18 ng/dL (ref 0.82–1.77)
TSH: 1.31 u[IU]/mL (ref 0.450–4.500)

## 2019-05-19 ENCOUNTER — Ambulatory Visit (INDEPENDENT_AMBULATORY_CARE_PROVIDER_SITE_OTHER): Payer: Commercial Managed Care - PPO | Admitting: Allergy and Immunology

## 2019-05-19 ENCOUNTER — Other Ambulatory Visit: Payer: Self-pay

## 2019-05-19 VITALS — BP 122/82 | HR 56 | Resp 16

## 2019-05-19 DIAGNOSIS — J3089 Other allergic rhinitis: Secondary | ICD-10-CM | POA: Diagnosis not present

## 2019-05-19 DIAGNOSIS — G4730 Sleep apnea, unspecified: Secondary | ICD-10-CM

## 2019-05-19 DIAGNOSIS — G43909 Migraine, unspecified, not intractable, without status migrainosus: Secondary | ICD-10-CM

## 2019-05-19 DIAGNOSIS — K219 Gastro-esophageal reflux disease without esophagitis: Secondary | ICD-10-CM | POA: Diagnosis not present

## 2019-05-19 DIAGNOSIS — K146 Glossodynia: Secondary | ICD-10-CM

## 2019-05-19 DIAGNOSIS — G629 Polyneuropathy, unspecified: Secondary | ICD-10-CM

## 2019-05-19 DIAGNOSIS — J4530 Mild persistent asthma, uncomplicated: Secondary | ICD-10-CM

## 2019-05-19 DIAGNOSIS — T7800XA Anaphylactic reaction due to unspecified food, initial encounter: Secondary | ICD-10-CM

## 2019-05-19 MED ORDER — PULMICORT FLEXHALER 180 MCG/ACT IN AEPB: INHALATION_SPRAY | RESPIRATORY_TRACT | 5 refills | Status: DC

## 2019-05-19 NOTE — Progress Notes (Signed)
[place]   Follow-up Note  Referring Provider: Vertell NovakMitchell, Meredith T, F* Primary Provider: Ninfa MeekerMitchell, Meredith T, FNP Date of Office Visit: 05/19/2019  Subjective:   Real ConsJesse Berry (DOB: June 17, 1980) is a 38 y.o. male who [facility] in re-evaluation of the following:  HPI: Martin CumminsJesse returns to this clinic in reevaluation of a multitude of issues including asthma and allergic rhinitis and LPR and migraine syndrome and allergic urticaria / allergic reaction and sleep apnea and anxiety and neuropathy and burning mouth syndrome addressed during his initial evaluation of 23 April 2019.  His breathing issue has resolved.  He continues to use Pulmicort.  He has no issues with his nose.  He continues to use a nasal steroid currently Qnasl.  His mouth issue has resolved.  He discontinued his Symbicort during his last visit and is now presently using Pulmicort.  He still has a difficult time with his CPAP machine but he is attempting to be compliant.  His hypertension appears to be under better control at this point.  He still has a lot of postnasal drip and throat clearing.  He has dramatically consolidated his caffeine use.  He is mostly drinking decaffeinated tea although he still has at least 1 caffeinated diet drink per day and still occasionally has some chocolate.  He has not had any allergic reactions or hives.  He is mammal free at this point in time.  He has not been having any headaches.  The issue with the intermittent burning of his feet and lower extremities is still present.  He did see a spinal specialist who told him that this was not a spinal nerve issue.  Allergies as of 05/19/2019      Reactions   Levaquin [levofloxacin] Rash   Pt states he gets SOB & rash       Medication List    albuterol 108 (90 Base) MCG/ACT inhaler Commonly known as: VENTOLIN HFA Inhale into the lungs every  6 (six) hours as needed for wheezing or shortness of breath.   amLODipine 5 MG tablet Commonly known as: NORVASC Take 1 tablet (5 mg total) by mouth daily.   EpiPen 2-Pak 0.3 mg/0.3 mL Soaj injection Generic drug: EPINEPHrine Inject 0.3 mg into the muscle as needed for anaphylaxis.   famotidine 40 MG tablet Commonly known as: PEPCID Take 1 tablet (40 mg total) by mouth at bedtime.   hydrOXYzine 50 MG tablet Commonly known as: ATARAX/VISTARIL Take 50 mg by mouth every 6 (six) hours as needed.   omeprazole 40 MG capsule Commonly known as: PRILOSEC Take 1 capsule (40 mg total) by mouth every morning.   Qnasl 80 MCG/ACT Aers Generic drug: Beclomethasone Dipropionate Place 1 spray into the nose 2 (two) times daily.   Symbicort 160-4.5 MCG/ACT inhaler Generic drug: budesonide-formoterol Inhale 2 puffs into the lungs daily.   telmisartan-hydrochlorothiazide 40-12.5 MG tablet Commonly known as: Micardis HCT Take 1 tablet by mouth daily.       Past Medical History:  Diagnosis Date  . DOE (dyspnea on exertion)   . GERD (gastroesophageal reflux disease)   . Hyperlipidemia   . Hypertension   . OSA on CPAP   . PAC (premature atrial contraction)   . Palpitations   . PVC (premature ventricular contraction)     Past Surgical History:  Procedure Laterality Date  . NO PAST SURGERIES      Review of systems negative  except as noted in HPI / PMHx or noted below:  Review of Systems  Constitutional: Negative.   HENT: Negative.   Eyes: Negative.   Respiratory: Negative.   Cardiovascular: Negative.   Gastrointestinal: Negative.   Genitourinary: Negative.   Musculoskeletal: Negative.   Skin: Negative.   Neurological: Negative.   Endo/Heme/Allergies: Negative.   Psychiatric/Behavioral: Negative.      Objective:   Vitals:   05/19/19 1716  BP: 122/82  Pulse: (!) 56  Resp: 16  SpO2: 97%          Physical Exam Constitutional:      Appearance: He is not  diaphoretic.  HENT:     Head: Normocephalic.     Right Ear: Tympanic membrane, ear canal and external ear normal.     Left Ear: Tympanic membrane, ear canal and external ear normal.     Nose: Nose normal. No mucosal edema or rhinorrhea.     Mouth/Throat:     Pharynx: Uvula midline. No oropharyngeal exudate.  Eyes:     Conjunctiva/sclera: Conjunctivae normal.  Neck:     Thyroid: No thyromegaly.     Trachea: Trachea normal. No tracheal tenderness or tracheal deviation.  Cardiovascular:     Rate and Rhythm: Normal rate and regular rhythm.     Heart sounds: Normal heart sounds, S1 normal and S2 normal. No murmur.  Pulmonary:     Effort: No respiratory distress.     Breath sounds: Normal breath sounds. No stridor. No wheezing or rales.  Lymphadenopathy:     Head:     Right side of head: No tonsillar adenopathy.     Left side of head: No tonsillar adenopathy.     Cervical: No cervical adenopathy.  Skin:    Findings: No erythema or rash.     Nails: There is no clubbing.  Neurological:     Mental Status: He is alert.     Diagnostics:    Spirometry was performed and demonstrated an FEV1 of 4.18 at 88 % of predicted.  Results of blood tests obtained 23 April 2019 identified WBC 6.8, absolute eosinophil 400, absolute lymphocyte 2100, hemoglobin 16.2, platelet 202, alpha gal IgE 50.0 KU/L, Beef 5.66 KU/L, Lamb 0.44 KU/L, pork 2.42 KU/L, IgE antibodies directed against cat and dog on a aero allergen two profile, TSH 1.310 IU/mL, free T4 1.18 NG/DL, thyroid peroxidase antibody less than 9 U/mL  Assessment and Plan:   1. Asthma, well controlled, mild persistent   2. Perennial allergic rhinitis   3. LPRD (laryngopharyngeal reflux disease)   4. 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 Sleep apnea with use of continuous positive airway pressure (CPAP)   7. BMS (burning mouth syndrome)   8. Neuropathy     1.  Continue to treat and prevent  inflammation:   A.  Pulmicort 180-2 inhalations 1 time per day   B.  Qnasl 80-2 sprays each nostril 1 time per day  2.  Continue to treat and prevent reflux:   A.  Slowly consolidate all forms of caffeine  B.  Omeprazole 40 mg tablet in a.m.  C.  Famotidine 40 mg tablet in p.m.  3.  If needed:   A.  Albuterol HFA-2 inhalations every 4-6 hours  B.  Can continue hydroxyzine 25-50 mg every 6 hours  C.  Auvi-Q 0.3 / Epi-Pen, Benadryl, MD/ER evaluation for allergic reaction  4.  Return to clinic in 12 weeks or earlier if problem  5. Obtain flu vaccine and Covid vaccine  6. Consider evaluation with neurologist for lower extremity burning issue  Hilery appears to be doing better regarding several issues.  I would like for him to remain on anti-inflammatory agents for both his upper and lower airway and I would like for him to continue to treat reflux as noted above.  He is very allergic to mammal meats and he needs to avoid consumption of this food product.  As well, he would benefit from continuing to consolidate his caffeine consumption.  I will see him back in his clinic in 12 weeks or earlier if there is a problem.  Laurette Schimke, MD Allergy / Immunology [facility]

## 2019-05-19 NOTE — Patient Instructions (Addendum)
  1.  Continue to treat and prevent inflammation:   A.  Pulmicort 180-2 inhalations 1 time per day   B.  Qnasl 80-2 sprays each nostril 1 time per day  2.  Continue to treat and prevent reflux:   A.  Slowly consolidate all forms of caffeine  B.  Omeprazole 40 mg tablet in a.m.  C.  Famotidine 40 mg tablet in p.m.  3.  If needed:   A.  Albuterol HFA-2 inhalations every 4-6 hours  B.  Can continue hydroxyzine 25-50 mg every 6 hours  C.  Auvi-Q 0.3 / Epi-Pen , Benadryl, MD/ER evaluation for allergic reaction  4.  Return to clinic in 12 weeks or earlier if problem  5. Obtain flu vaccine and Covid vaccine  6. Consider evaluation with neurologist for lower extremity burning issue

## 2019-05-20 ENCOUNTER — Encounter: Payer: Self-pay | Admitting: Allergy and Immunology

## 2019-08-11 ENCOUNTER — Ambulatory Visit: Payer: Commercial Managed Care - PPO | Admitting: Allergy and Immunology

## 2019-09-15 ENCOUNTER — Ambulatory Visit: Payer: PRIVATE HEALTH INSURANCE | Admitting: Internal Medicine

## 2020-05-12 ENCOUNTER — Telehealth: Payer: Self-pay

## 2020-05-12 NOTE — Telephone Encounter (Signed)
Please inform wife that the ongoing symptoms of blurred vision, tingling in upper and lower extremities, headaches, heart palpitations, or not a manifestation of mold exposure.  There is obviously something else going on that is giving rise to this issue and this will probably need to be explored if it is a continuous or persistent issue.  I am not sure that the allergy clinic is the correct avenue of investigation for these issues however.

## 2020-05-12 NOTE — Telephone Encounter (Signed)
Patient's wife called in to ask about a mold exposure test to see if Griffin had been exposed to mold. She reported ongoing symptoms of blurred vision, tingling in upper and lower extremities, headaches, heart palpitations. She says that they did see cardiology and everything checks out normal. The primary care provider told him everything checked out. His wife denies allergic symptoms, even says that since stopping exposure to mammalian meat he has not had to use any prescribed medication with the exception of the hydroxyzine as needed. I did offer an appointment but it was declined because she says that he would not take off work just to come in and be told that there was nothing we could do. I did ask if he had seen neurology and she did not answer that. I did reiterate the need for that appointment given the symptoms she reported that Juanmanuel was having. Mrs. Marvin verbalized understanding and thanked me for taking the time to talk with her and to give her the information that I did.

## 2020-05-12 NOTE — Telephone Encounter (Signed)
I spoke with Mrs. Martin Berry and informed of this information. She is working with the PCP to get the referral. I did let her know that if there was an issue to give [place] a call back.

## 2020-08-02 ENCOUNTER — Other Ambulatory Visit: Payer: Self-pay

## 2020-08-02 ENCOUNTER — Ambulatory Visit (INDEPENDENT_AMBULATORY_CARE_PROVIDER_SITE_OTHER): Payer: Commercial Managed Care - PPO | Admitting: Neurology

## 2020-08-02 ENCOUNTER — Encounter: Payer: Self-pay | Admitting: Neurology

## 2020-08-02 VITALS — BP 143/90 | HR 76 | Ht 75.0 in | Wt 268.6 lb

## 2020-08-02 DIAGNOSIS — R202 Paresthesia of skin: Secondary | ICD-10-CM

## 2020-08-02 DIAGNOSIS — M79671 Pain in right foot: Secondary | ICD-10-CM

## 2020-08-02 DIAGNOSIS — M79672 Pain in left foot: Secondary | ICD-10-CM | POA: Diagnosis not present

## 2020-08-02 DIAGNOSIS — R200 Anesthesia of skin: Secondary | ICD-10-CM

## 2020-08-02 NOTE — Progress Notes (Addendum)
[facility]    Provider:  Dr Lucia Gaskins Requesting Provider: Street, Stephanie Coup, * Primary Care Provider:  Ninfa Meeker, FNP  CC:  Hand and feet pain  HPI:  Martin Berry is a 40 y.o. male here as requested by Street, Stephanie Coup, * for paresthesias.  Past medical history hypertension, hyperlipidemia, GERD, metabolic syndrome, obstructive sleep apnea on CPAP, situational anxiety, multilevel degenerative disc disease, lumbar back pain with radiculopathy affecting the lower extremity.  I reviewed Dr. Timothy Lasso notes: Patient presented with a complaint of numbness and tingling in the hands and feet been going on "a while" but worsened in the last several weeks (appointment May 17, 2020).  Tingling bilaterally lower extremities, intermittent nature does not seem to be related to any other symptoms of lower back pain loss of mobility, patient has several other complaints that are present in nature despite referral to several different specialist including allergy, pulmonology, cardiology, spine, patient states he continues with hands and legs falling asleep and feeling paralyzed when sleeping at night.  Dr. Timothy Lasso examination was normal including head, eyes, ears, nose, throat, chest and lung examination, cardiovascular and, peripheral vascular and neurologic all normal.  If he stays on his feet long enough they hurt, they ache, they sometimes burn, been ongoing for well over a year, worsening recently, wife provides much information as well. Taking him off of a blood pressure pill helps, The whole foot up to the ankles even the skin burns. If the sun shines on it the sun feels hotter. Comes and goes, no known triggers, random days, different activities, his feet hurt he stand on hard ground he stands all day unclear if related, it may happen with his first steps in the morning. It happened a month ago and his feet were burning, going to bed would be better. He has numbness and  tingling in his hands, more on the left, digit one, feels like his left hand goes to sleep worse at night. He has migraines and with the migraines may lose peripheral vision but hasn;t had that in years.   Reviewed notes, labs and imaging from outside physicians, which showed:  May 17, 2020: Hemoglobin A1c 5.3, CMP with BUN 12 and creatinine 1 otherwise unremarkable,  Review of Systems: Patient complains of symptoms per HPI as well as the following symptoms: blurry vision. Pertinent negatives and positives per HPI. All others negative.   Social History   Socioeconomic History  . Marital status: Married    Spouse name: Not on file  . Number of children: 2  . Years of education: tech school  . Highest education level: Not on file  Occupational History  . Occupation: lineman  Tobacco Use  . Smoking status: Never Smoker  . Smokeless tobacco: Former Neurosurgeon    Types: Engineer, drilling  . Vaping Use: Never used  Substance and Sexual Activity  . Alcohol use: Yes    Comment: social  . Drug use: Never  . Sexual activity: Not on file  Other Topics Concern  . Not on file  Social History Narrative   Lives at home with his family.   Right-handed.   Occasional soda.    Social Determinants of Health   Financial Resource Strain: Not on file  Food Insecurity: Not on file  Transportation Needs: Not on file  Physical Activity: Not on file  Stress: Not on file  Social Connections: Not on file  Intimate Partner Violence: Not on file    Family  History  Problem Relation Age of Onset  . Healthy Mother   . Hyperlipidemia Father   . Heart attack Father   . Diabetes Maternal Uncle   . Diabetes Maternal Grandfather     Past Medical History:  Diagnosis Date  . Allergy to alpha-gal   . DOE (dyspnea on exertion)   . GERD (gastroesophageal reflux disease)   . Hyperlipidemia   . Hypertension   . Neuropathy   . OSA on CPAP   . PAC (premature atrial contraction)   . Palpitations    . PVC (premature ventricular contraction)     Patient Active Problem List   Diagnosis Date Noted  . Hyperlipidemia   . Palpitations   . PVC (premature ventricular contraction)   . PAC (premature atrial contraction)   . Chronic vasomotor rhinitis 11/11/2018  . Obstructive sleep apnea 04/18/2018  . Essential hypertension 04/18/2018  . Atypical chest pain 04/18/2018  . Dyspnea on exertion 04/18/2018  . Mixed dyslipidemia 04/18/2018    Past Surgical History:  Procedure Laterality Date  . NO PAST SURGERIES      Current Outpatient Medications  Medication Sig Dispense Refill  . amLODipine (NORVASC) 5 MG tablet Take 1 tablet (5 mg total) by mouth daily. 90 tablet 1  . EPINEPHrine 0.3 mg/0.3 mL IJ SOAJ injection Inject 0.3 mg into the muscle as needed for anaphylaxis.    . famotidine (PEPCID) 40 MG tablet Take 1 tablet (40 mg total) by mouth at bedtime. 30 tablet 5  . hydrOXYzine (ATARAX/VISTARIL) 50 MG tablet Take 50 mg by mouth every 6 (six) hours as needed.    Marland Kitchen omeprazole (PRILOSEC) 40 MG capsule Take 1 capsule (40 mg total) by mouth every morning. 30 capsule 5  . SYMBICORT 160-4.5 MCG/ACT inhaler Inhale 2 puffs into the lungs daily.      No current facility-administered medications for this visit.    Allergies as of 08/02/2020 - Review Complete 08/02/2020  Allergen Reaction Noted  . Levaquin [levofloxacin] Rash 11/11/2018    Vitals: BP (!) 143/90   Pulse 76   Ht 6\' 3"  (1.905 m)   Wt 268 lb 9.6 oz (121.8 kg)   BMI 33.57 kg/m  Last Weight:  Wt Readings from Last 1 Encounters:  08/02/20 268 lb 9.6 oz (121.8 kg)   Last Height:   Ht Readings from Last 1 Encounters:  08/02/20 6\' 3"  (1.905 m)     Physical exam: Exam: Gen: NAD, conversant, well nourised, obese, well groomed                     CV: RRR, no MRG. No Carotid Bruits. No peripheral edema, warm, nontender Eyes: Conjunctivae clear without exudates or hemorrhage  Neuro: Detailed Neurologic  Exam  Speech:    Speech is normal; fluent and spontaneous with normal comprehension.  Cognition:    The patient is oriented to person, place, and time;     recent and remote memory intact;     language fluent;     normal attention, concentration,     fund of knowledge Cranial Nerves:    The pupils are equal, round, and reactive to light. The fundi are normal and spontaneous venous pulsations are present. Visual fields are full to finger confrontation. Extraocular movements are intact. Trigeminal sensation is intact and the muscles of mastication are normal. The face is symmetric. The palate elevates in the midline. Hearing intact. Voice is normal. Shoulder shrug is normal. The tongue has normal motion without fasciculations.  Coordination:    No dysmetria or ataxia  Gait:    Heel-toe and tandem gait are normal.   Motor Observation:    No asymmetry, no atrophy, and no involuntary movements noted. Tone:    Normal muscle tone.    Posture:    Posture is normal. normal erect    Strength:    Strength is V/V in the upper and lower limbs.      Sensation: intact to LT, pin prick, vibration, temperature.     Reflex Exam:  DTR's:    Deep tendon reflexes in the upper and lower extremities are normal bilaterally.   Toes:    The toes are downgoing bilaterally.   Clonus:    Clonus is absent.    Assessment/Plan:  40 year old with foot pain, wears boots all day and stands all day on ground, he has burning sometimes (hasn't had burning for a month). I don;t think this is nerve damage(peripheral neuropathy) I think maybe his nerves are telling him his feet are hurting after standing all day, may consider structural causes like plantar fasciitis, he wears boots without foot support recommend seeing podiatry. 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 Offered emg/ncs, they decline at this time. Sensory exam completely normal.   Orders Placed This Encounter  Procedures  . B12  and Folate Panel  . Methylmalonic acid, serum  . Vitamin B6  . Vitamin B1   No orders of the defined types were placed in this encounter.   Cc: Street, Stephanie Coup, Ninfa Meeker, [place]  Naomie Dean, MD  [facility] [address] [place], [place] [address]  Phone [phone] Fax [phone]

## 2020-08-02 NOTE — Addendum Note (Signed)
Addended by: Naomie Dean B on: 08/02/2020 08:36 AM   Modules accepted: Level of Service

## 2020-08-02 NOTE — Patient Instructions (Signed)
Bradley and Daroff's neurology in clinical practice (8th ed., pp. 1853- 1929). Elsevier."> Goldman-Cecil medicine (26th ed., pp. 2489- 2501). Elsevier.">  Peripheral Neuropathy Peripheral neuropathy is a type of nerve damage. It affects nerves that carry signals between the spinal cord and the arms, legs, and the rest of the body (peripheral nerves). It does not affect nerves in the spinal cord or brain. In peripheral neuropathy, one nerve or a group of nerves may be damaged. Peripheral neuropathy is a broad category that includes many specific nerve disorders, like diabetic neuropathy, hereditary neuropathy, and carpal tunnel syndrome. What are the causes? This condition may be caused by:  Diabetes. This is the most common cause of peripheral neuropathy.  Nerve injury.  Pressure or stress on a nerve that lasts a long time.  Lack (deficiency) of B vitamins. This can result from alcoholism, poor diet, or a restricted diet.  Infections.  Autoimmune diseases, such as rheumatoid arthritis and systemic lupus erythematosus.  Nerve diseases that are passed from parent to child (inherited).  Some medicines, such as cancer medicines (chemotherapy).  Poisonous (toxic) substances, such as lead and mercury.  Too little blood flowing to the legs.  Kidney disease.  Thyroid disease. In some cases, the cause of this condition is not known. What are the signs or symptoms? Symptoms of this condition depend on which of your nerves is damaged. Common symptoms include:  Loss of feeling (numbness) in the feet, hands, or both.  Tingling in the feet, hands, or both.  Burning pain.  Very sensitive skin.  Weakness.  Not being able to move a part of the body (paralysis).  Muscle twitching.  Clumsiness or poor coordination.  Loss of balance.  Not being able to control your bladder.  Feeling dizzy.  Sexual problems. How is this diagnosed? Diagnosing and finding the cause of peripheral  neuropathy can be difficult. Your health care provider will take your medical history and do a physical exam. A neurological exam will also be done. This involves checking things that are affected by your brain, spinal cord, and nerves (nervous system). For example, your health care provider will check your reflexes, how you move, and what you can feel. You may have other tests, such as:  Blood tests.  Electromyogram (EMG) and nerve conduction tests. These tests check nerve function and how well the nerves are controlling the muscles.  Imaging tests, such as CT scans or MRI to rule out other causes of your symptoms.  Removing a small piece of nerve to be examined in a lab (nerve biopsy).  Removing and examining a small amount of the fluid that surrounds the brain and spinal cord (lumbar puncture). How is this treated? Treatment for this condition may involve:  Treating the underlying cause of the neuropathy, such as diabetes, kidney disease, or vitamin deficiencies.  Stopping medicines that can cause neuropathy, such as chemotherapy.  Medicine to help relieve pain. Medicines may include: ? Prescription or over-the-counter pain medicine. ? Antiseizure medicine. ? Antidepressants. ? Pain-relieving patches that are applied to painful areas of skin.  Surgery to relieve pressure on a nerve or to destroy a nerve that is causing pain.  Physical therapy to help improve movement and balance.  Devices to help you move around (assistive devices). Follow these instructions at home: Medicines  Take over-the-counter and prescription medicines only as told by your health care provider. Do not take any other medicines without first asking your health care provider.  Do not drive or use heavy   machinery while taking prescription pain medicine. Lifestyle  Do not use any products that contain nicotine or tobacco, such as cigarettes and e-cigarettes. Smoking keeps blood from reaching damaged nerves.  If you need help quitting, ask your health care provider.  Avoid or limit alcohol. Too much alcohol can cause a vitamin B deficiency, and vitamin B is needed for healthy nerves.  Eat a healthy diet. This includes: ? Eating foods that are high in fiber, such as fresh fruits and vegetables, whole grains, and beans. ? Limiting foods that are high in fat and processed sugars, such as fried or sweet foods.   General instructions  If you have diabetes, work closely with your health care provider to keep your blood sugar under control.  If you have numbness in your feet: ? Check every day for signs of injury or infection. Watch for redness, warmth, and swelling. ? Wear padded socks and comfortable shoes. These help protect your feet.  Develop a good support system. Living with peripheral neuropathy can be stressful. Consider talking with a mental health specialist or joining a support group.  Use assistive devices and attend physical therapy as told by your health care provider. This may include using a walker or a cane.  Keep all follow-up visits as told by your health care provider. This is important.   Contact a health care provider if:  You have new signs or symptoms of peripheral neuropathy.  You are struggling emotionally from dealing with peripheral neuropathy.  Your pain is not well-controlled. Get help right away if:  You have an injury or infection that is not healing normally.  You develop new weakness in an arm or leg.  You have fallen or do so frequently. Summary  Peripheral neuropathy is when the nerves in the arms, or legs are damaged, resulting in numbness, weakness, or pain.  There are many causes of peripheral neuropathy, including diabetes, pinched nerves, vitamin deficiencies, autoimmune disease, and hereditary conditions.  Diagnosing and finding the cause of peripheral neuropathy can be difficult. Your health care provider will take your medical history, do a  physical exam, and do tests, including blood tests and nerve function tests.  Treatment involves treating the underlying cause of the neuropathy and taking medicines to help control pain. Physical therapy and assistive devices may also help. This information is not intended to replace advice given to you by your health care provider. Make sure you discuss any questions you have with your health care provider. Document Revised: 02/24/2020 Document Reviewed: 02/24/2020 Elsevier Patient Education  2021 Elsevier Inc.   Plantar Fasciitis  Plantar fasciitis is a painful foot condition that affects the heel. It occurs when the band of tissue that connects the toes to the heel bone (plantar fascia) becomes irritated. This can happen as the result of exercising too much or doing other repetitive activities (overuse injury). Plantar fasciitis can cause mild irritation to severe pain that makes it difficult to walk or move. The pain is usually worse in the morning after sleeping, or after sitting or lying down for a period of time. Pain may also be worse after long periods of walking or standing. What are the causes? This condition may be caused by: Standing for long periods of time. Wearing shoes that do not have good arch support. Doing activities that put stress on joints (high-impact activities). This includes ballet and exercise that makes your heart beat faster (aerobic exercise), such as running. Being overweight. An abnormal way of walking (gait).  Tight muscles in the back of your lower leg (calf). High arches in your feet or flat feet. Starting a new athletic activity. What are the signs or symptoms? The main symptom of this condition is heel pain. Pain may get worse after the following: Taking the first steps after a time of rest, especially in the morning after awakening, or after you have been sitting or lying down for a while. Long periods of standing still. Pain may decrease after 30-45  minutes of activity, such as gentle walking. How is this diagnosed? This condition may be diagnosed based on your medical history, a physical exam, and your symptoms. Your health care provider will check for: A tender area on the bottom of your foot. A high arch in your foot or flat feet. Pain when you move your foot. Difficulty moving your foot. You may have imaging tests to confirm the diagnosis, such as: X-rays. Ultrasound. MRI. How is this treated? Treatment for plantar fasciitis depends on how severe your condition is. Treatment may include: Rest, ice, pressure (compression), and raising (elevating) the affected foot. This is called RICE therapy. Your health care provider may recommend RICE therapy along with over-the-counter pain medicines to manage your pain. Exercises to stretch your calves and your plantar fascia. A splint that holds your foot in a stretched, upward position while you sleep (night splint). Physical therapy to relieve symptoms and prevent problems in the future. Injections of steroid medicine (cortisone) to relieve pain and inflammation. Stimulating your plantar fascia with electrical impulses (extracorporeal shock wave therapy). This is usually the last treatment option before surgery. Surgery, if other treatments have not worked after 12 months. Follow these instructions at home: Managing pain, stiffness, and swelling If directed, put ice on the painful area. To do this: Put ice in a plastic bag, or use a frozen bottle of water. Place a towel between your skin and the bag or bottle. Roll the bottom of your foot over the bag or bottle. Do this for 20 minutes, 2-3 times a day. Wear athletic shoes that have air-sole or gel-sole cushions, or try soft shoe inserts that are designed for plantar fasciitis. Elevate your foot above the level of your heart while you are sitting or lying down.   Activity Avoid activities that cause pain. Ask your health care provider  what activities are safe for you. Do physical therapy exercises and stretches as told by your health care provider. Try activities and forms of exercise that are easier on your joints (low impact). Examples include swimming, water aerobics, and biking. General instructions Take over-the-counter and prescription medicines only as told by your health care provider. Wear a night splint while sleeping, if told by your health care provider. Loosen the splint if your toes tingle, become numb, or turn cold and blue. Maintain a healthy weight, or work with your health care provider to lose weight as needed. Keep all follow-up visits. This is important. Contact a health care provider if you have: Symptoms that do not go away with home treatment. Pain that gets worse. Pain that affects your ability to move or do daily activities. Summary Plantar fasciitis is a painful foot condition that affects the heel. It occurs when the band of tissue that connects the toes to the heel bone (plantar fascia) becomes irritated. Heel pain is the main symptom of this condition. It may get worse after exercising too much or standing still for a long time. Treatment varies, but it usually starts with rest,  ice, pressure (compression), and raising (elevating) the affected foot. This is called RICE therapy. Over-the-counter medicines can also be used to manage pain. This information is not intended to replace advice given to you by your health care provider. Make sure you discuss any questions you have with your health care provider. Document Revised: 09/01/2019 Document Reviewed: 09/01/2019 Elsevier Patient Education  2021 [employer].

## 2020-08-13 LAB — VITAMIN B6: Vitamin B6: 27.9 ug/L (ref 5.3–46.7)

## 2020-08-13 LAB — VITAMIN B1: Thiamine: 122.7 nmol/L (ref 66.5–200.0)

## 2020-08-13 LAB — METHYLMALONIC ACID, SERUM: Methylmalonic Acid: 150 nmol/L (ref 0–378)

## 2020-08-13 LAB — B12 AND FOLATE PANEL
Folate: 15.5 ng/mL (ref >3.0)
Vitamin B-12: 281 pg/mL (ref 232–1245)

## 2021-01-28 IMAGING — DX DG CHEST 2V
2 series · 2 of 2 positions shown · non-contrast
Comparison: March 12, 2018

CLINICAL DATA: Left-sided chest pain for 3 days

EXAM:
CHEST - 2 VIEW

[chest pa]
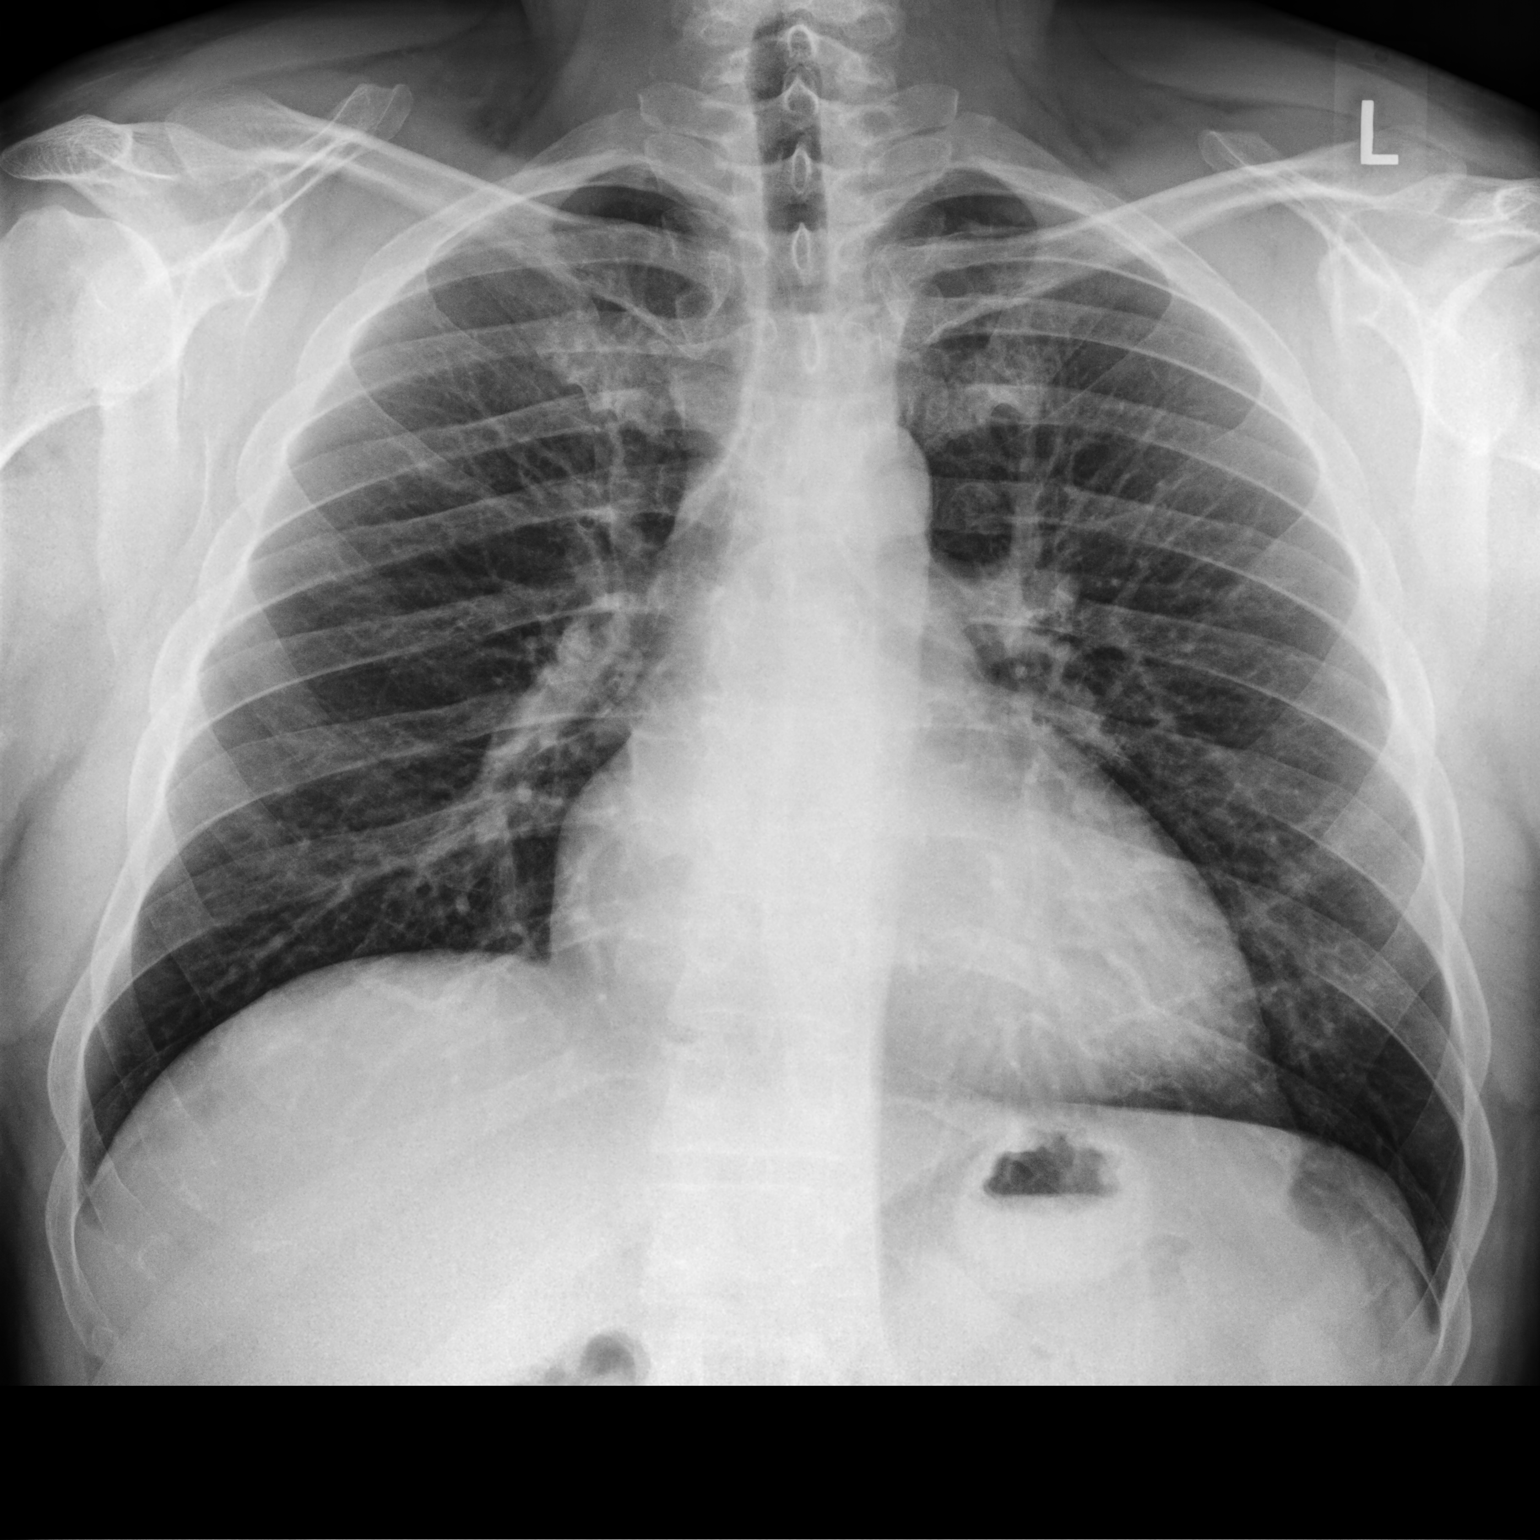

[chest lat]
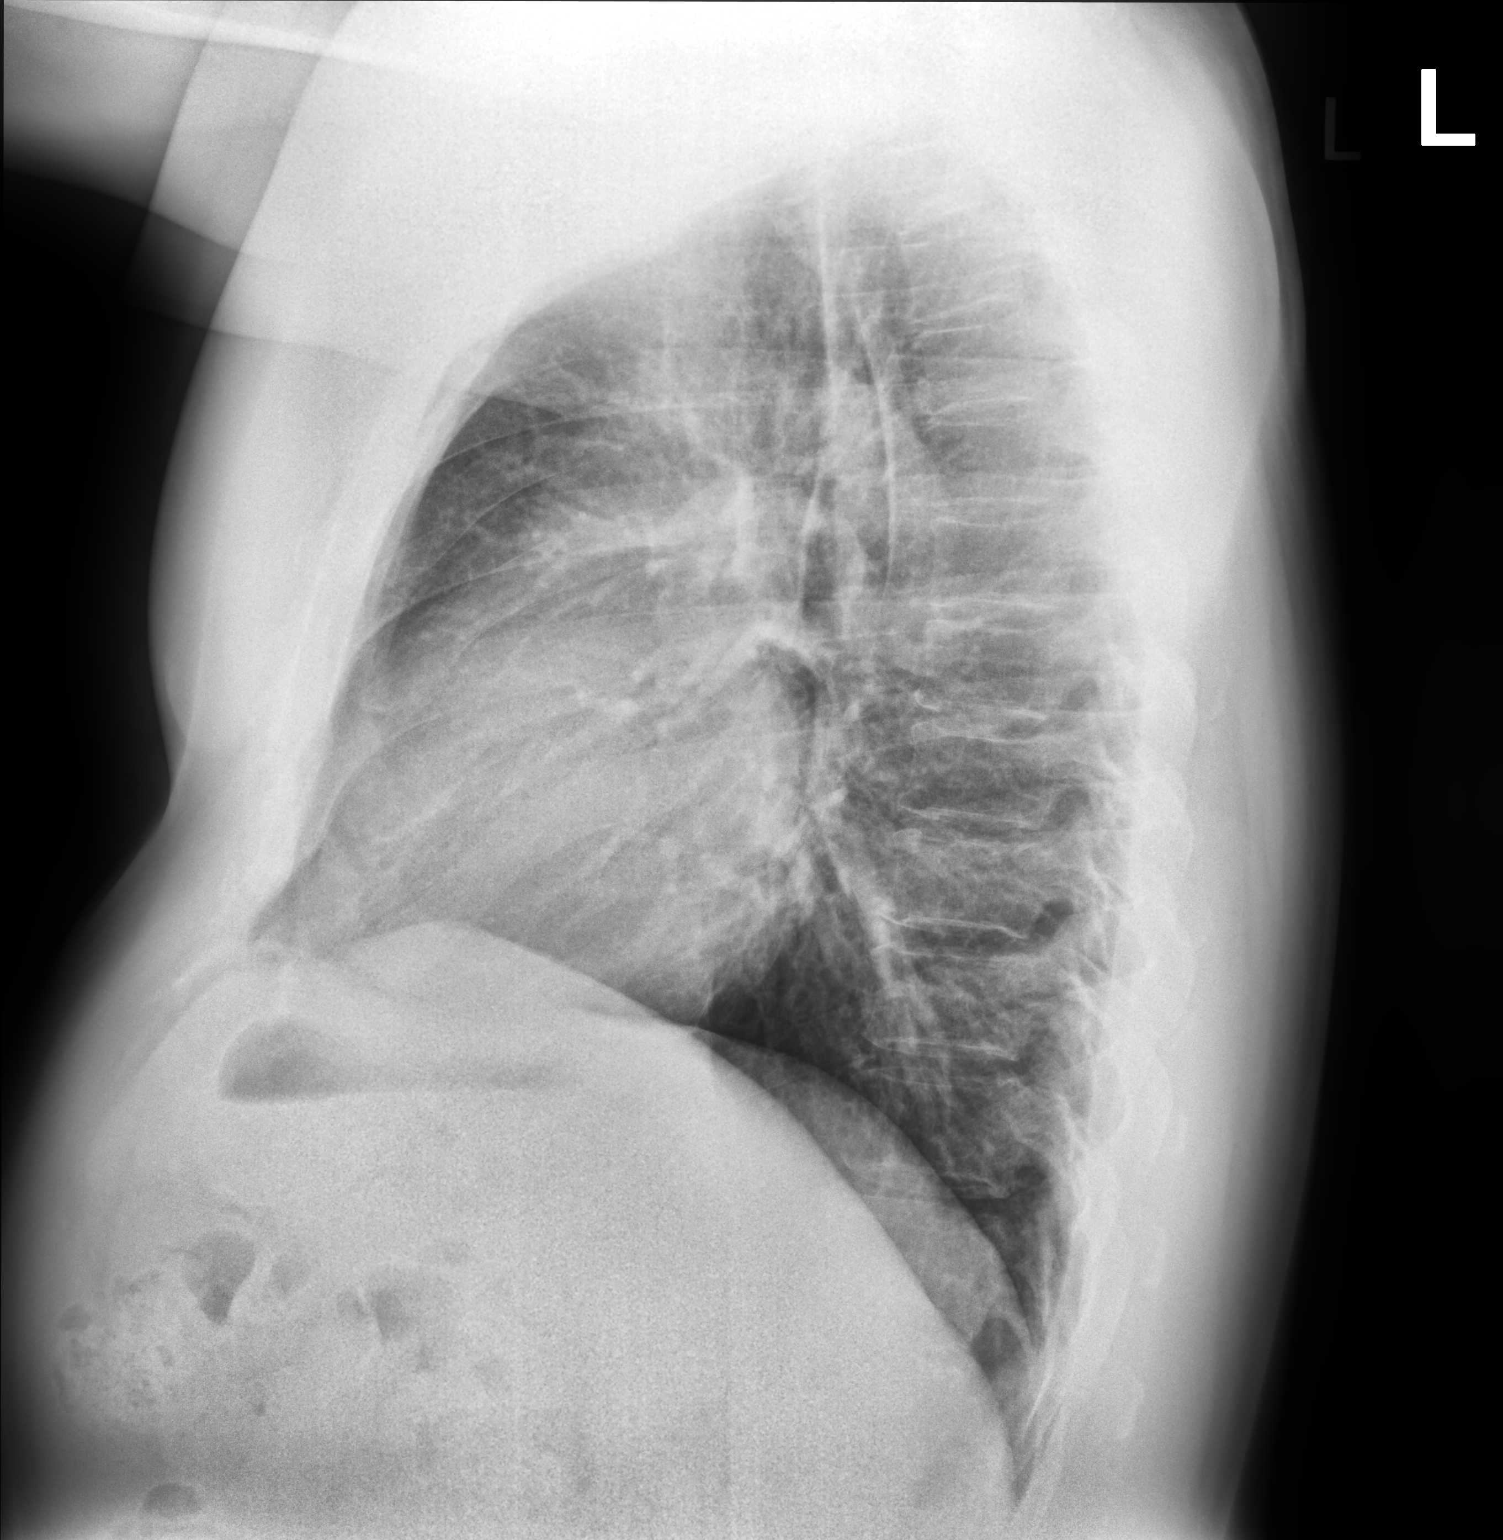

[2 of 2 positions shown; findings below may reference images not displayed]

FINDINGS: Mild stable cardiomegaly. The hila, mediastinum, lungs, and pleura
are otherwise normal.
IMPRESSION: No active cardiopulmonary disease.
